# Patient Record
Sex: Female | Born: 1970 | Race: White | Hispanic: Yes | Smoking: Never smoker
Health system: Southern US, Community
[De-identification: ages and names within clinical notes are randomized; demographics above are authoritative.]

## PROBLEM LIST (undated history)

## (undated) ENCOUNTER — Inpatient Hospital Stay: Payer: Self-pay

## (undated) DIAGNOSIS — O99210 Obesity complicating pregnancy, unspecified trimester: Secondary | ICD-10-CM

## (undated) DIAGNOSIS — O09529 Supervision of elderly multigravida, unspecified trimester: Secondary | ICD-10-CM

## (undated) DIAGNOSIS — O24419 Gestational diabetes mellitus in pregnancy, unspecified control: Secondary | ICD-10-CM

## (undated) DIAGNOSIS — F329 Major depressive disorder, single episode, unspecified: Secondary | ICD-10-CM

## (undated) DIAGNOSIS — F32A Depression, unspecified: Secondary | ICD-10-CM

## (undated) DIAGNOSIS — R87629 Unspecified abnormal cytological findings in specimens from vagina: Secondary | ICD-10-CM

## (undated) HISTORY — PX: NO PAST SURGERIES: SHX2092

---

## 2011-01-08 ENCOUNTER — Ambulatory Visit: Payer: Self-pay | Admitting: Advanced Practice Midwife

## 2011-02-18 ENCOUNTER — Encounter: Payer: Self-pay | Admitting: Maternal and Fetal Medicine

## 2011-06-30 ENCOUNTER — Inpatient Hospital Stay: Payer: Self-pay

## 2014-07-05 DIAGNOSIS — O09529 Supervision of elderly multigravida, unspecified trimester: Secondary | ICD-10-CM

## 2014-07-05 HISTORY — DX: Supervision of elderly multigravida, unspecified trimester: O09.529

## 2015-03-05 ENCOUNTER — Other Ambulatory Visit: Payer: Self-pay | Admitting: Advanced Practice Midwife

## 2015-03-05 DIAGNOSIS — F1010 Alcohol abuse, uncomplicated: Secondary | ICD-10-CM

## 2015-03-24 ENCOUNTER — Ambulatory Visit: Payer: Self-pay

## 2015-03-27 ENCOUNTER — Ambulatory Visit
Admission: RE | Admit: 2015-03-27 | Discharge: 2015-03-27 | Disposition: A | Discharge status: Home / Self Care | Payer: Medicaid Other | Source: Ambulatory Visit | Attending: Advanced Practice Midwife | Admitting: Advanced Practice Midwife

## 2015-03-27 ENCOUNTER — Ambulatory Visit
Admission: RE | Admit: 2015-03-27 | Discharge: 2015-03-27 | Disposition: A | Discharge status: Home / Self Care | Payer: Medicaid Other | Source: Ambulatory Visit | Attending: Obstetrics & Gynecology | Admitting: Obstetrics & Gynecology

## 2015-03-27 DIAGNOSIS — Z3A22 22 weeks gestation of pregnancy: Secondary | ICD-10-CM | POA: Insufficient documentation

## 2015-03-27 DIAGNOSIS — O09522 Supervision of elderly multigravida, second trimester: Secondary | ICD-10-CM | POA: Diagnosis not present

## 2015-03-27 DIAGNOSIS — O09529 Supervision of elderly multigravida, unspecified trimester: Secondary | ICD-10-CM | POA: Insufficient documentation

## 2015-03-27 DIAGNOSIS — F1010 Alcohol abuse, uncomplicated: Secondary | ICD-10-CM

## 2015-03-27 HISTORY — DX: Major depressive disorder, single episode, unspecified: F32.9

## 2015-03-27 HISTORY — DX: Unspecified abnormal cytological findings in specimens from vagina: R87.629

## 2015-03-27 HISTORY — DX: Supervision of elderly multigravida, unspecified trimester: O09.529

## 2015-03-27 HISTORY — DX: Obesity complicating pregnancy, unspecified trimester: O99.210

## 2015-03-27 HISTORY — DX: Depression, unspecified: F32.A

## 2015-03-27 NOTE — Progress Notes (Addendum)
Referring Provider:   [facility] Department Length of Consultation: 40 minutes  [name] was referred to [facility] for genetic counseling because of advanced maternal age.  The patient will be 44 years old at the time of delivery.  This note summarizes the information we discussed.    We explained that the chance of a chromosome abnormality increases with maternal age.  Chromosomes and examples of chromosome problems were reviewed.  Humans typically have 46 chromosomes in each cell, with half passed through each sperm and egg.  Any change in the number or structure of chromosomes can increase the risk of problems in the physical and mental development of a pregnancy.   Based upon age of the patient and the current gestational age, the chance of any chromosome abnormality was 1 in 58. The chance of Down syndrome, the most common chromosome problem associated with maternal age, was 1 in 73.  The risk of chromosome problems is in addition to the 3% general population risk for birth defects and mental retardation.  The greatest chance, of course, is that the baby would be born in good health.  We discussed the following prenatal screening and testing options for this pregnancy:  Maternal serum marker screening, a blood test that measures pregnancy proteins, can provide risk assessments for Down syndrome, trisomy 18, and open neural tube defects (spina bifida, anencephaly). Because it does not directly examine the fetus, it cannot positively diagnose or rule out these problems. This testing was ordered previously by [facility], however, her dating changed today by ultrasound so a recalculation of the results is pending.    Targeted ultrasound uses high frequency sound waves to create an image of the developing fetus.  An ultrasound is often recommended as a routine means of evaluating the pregnancy.  It is also used to screen for fetal anatomy problems (for example, a heart  defect) that might be suggestive of a chromosomal or other abnormality.   Amniocentesis involves the removal of a small amount of amniotic fluid from the sac surrounding the fetus with the use of a thin needle inserted through the maternal abdomen and uterus.  Ultrasound guidance is used throughout the procedure.  Fetal cells from amniotic fluid are directly evaluated and > 99.5% of chromosome problems and > 98% of open neural tube defects can be detected. This procedure is generally performed after the 15th week of pregnancy.  The main risks to this procedure include complications leading to miscarriage in less than 1 in 200 cases (0.5%).  We also reviewed the availability of cell free fetal DNA testing from maternal blood to determine whether or not the baby may have either Down syndrome, trisomy 47, or trisomy 22.  This test utilizes a maternal blood sample and DNA sequencing technology to isolate circulating cell free fetal DNA from maternal plasma.  The fetal DNA can then be analyzed for DNA sequences that are derived from the three most common chromosomes involved in aneuploidy, chromosomes 13, 18, and 21.  If the overall amount of DNA is greater than the expected level for any of these chromosomes, aneuploidy is suspected.  While we do not consider it a replacement for invasive testing and karyotype analysis, a negative result from this testing would be reassuring, though not a guarantee of a normal chromosome complement for the baby.  An abnormal result is certainly suggestive of an abnormal chromosome complement, though we would still recommend CVS or amniocentesis to confirm any findings from  this testing.  We obtained a detailed family history and pregnancy history. This is the fourth pregnancy for this couple.  They have three children who are in good health.  The patient also has a 42 year old from another relationship who is in good health.  The patient stated that the father of the baby has a  maternal aunt with short stature.  We discussed that more information would be needed to assess the concern based on this history.  The remainder of the family history is unremarkable for birth defects, mental retardation, recurrent pregnancy loss or known chromosome abnormalities.  [name] reported no complications other than a UTI in this pregnancy.  She reported some alcohol use prior to learning that she was pregnant, but stated no use since that time.  Alcohol consumption during pregnancy has been associated with a number of birth defects including growth delays, small head size, heart defects, eye anomalies and facial differences as well as learning disabilities and behavioral problems.  The risk of these to occur tends to increase with the amount of alcohol consumed, however, malformations have been seen with as little as two drinks per day.  Because there is no safe amount of alcohol consumption during pregnancy, we suggest women completely avoid alcohol while pregnant.  A level 2 ultrasound and fetal echocardiogram (a detailed ultrasound of the fetal heart after 20 weeks) may help to detect growth delays or birth defects associated with alcohol use.  However, it is important to remember that not all birth defects can be identified prenatally.  After consideration of the options, [name] elected to proceed with a detailed ultrasound.  She declined InformaSeq testing and amniocentesis.  Results of the recalculated maternal serum screening will be communicated with [facility] when they become available.  An ultrasound was performed at the time of the visit.  The gestational age was consistent with  22 weeks.  Fetal anatomy appeared normal, though this cannot rule out chromosome conditions.  Please refer to the ultrasound report for details of that study.  [name] was encouraged to call with questions or concerns.  We can be contacted at [phone].   Cherly Anderson, MS,  CGC  Addendum:  The recalculation of the maternal serum screening results is within normal limits.  Grotegut, [place] A, MD

## 2015-04-14 ENCOUNTER — Observation Stay: Payer: Medicaid Other

## 2015-04-14 ENCOUNTER — Encounter: Payer: Self-pay | Admitting: Emergency Medicine

## 2015-04-14 ENCOUNTER — Observation Stay
Admission: EM | Admit: 2015-04-14 | Discharge: 2015-04-14 | Disposition: A | Discharge status: Home / Self Care | Payer: Medicaid Other | Attending: Obstetrics and Gynecology | Admitting: Obstetrics and Gynecology

## 2015-04-14 DIAGNOSIS — M549 Dorsalgia, unspecified: Secondary | ICD-10-CM | POA: Insufficient documentation

## 2015-04-14 DIAGNOSIS — Y998 Other external cause status: Secondary | ICD-10-CM | POA: Diagnosis not present

## 2015-04-14 DIAGNOSIS — Y92096 Garden or yard of other non-institutional residence as the place of occurrence of the external cause: Secondary | ICD-10-CM | POA: Insufficient documentation

## 2015-04-14 DIAGNOSIS — S098XXA Other specified injuries of head, initial encounter: Secondary | ICD-10-CM

## 2015-04-14 DIAGNOSIS — Z3A22 22 weeks gestation of pregnancy: Secondary | ICD-10-CM | POA: Diagnosis not present

## 2015-04-14 DIAGNOSIS — O09522 Supervision of elderly multigravida, second trimester: Secondary | ICD-10-CM

## 2015-04-14 DIAGNOSIS — Y93H2 Activity, gardening and landscaping: Secondary | ICD-10-CM | POA: Insufficient documentation

## 2015-04-14 DIAGNOSIS — R51 Headache: Secondary | ICD-10-CM | POA: Diagnosis not present

## 2015-04-14 DIAGNOSIS — W1809XA Striking against other object with subsequent fall, initial encounter: Secondary | ICD-10-CM | POA: Diagnosis not present

## 2015-04-14 DIAGNOSIS — O26892 Other specified pregnancy related conditions, second trimester: Secondary | ICD-10-CM | POA: Diagnosis not present

## 2015-04-14 LAB — GLUCOSE, CAPILLARY: GLUCOSE-CAPILLARY: 128 mg/dL — AB (ref 65–99)

## 2015-04-14 MED ORDER — CYCLOBENZAPRINE HCL 10 MG PO TABS
10.0000 mg | ORAL_TABLET | Freq: Three times a day (TID) | ORAL | Status: DC | PRN
  Administered 2015-04-14: 10 mg via ORAL
  Filled 2015-04-14: qty 1

## 2015-04-14 MED ORDER — BUTALBITAL-APAP-CAFFEINE 50-325-40 MG PO TABS
2.0000 | ORAL_TABLET | ORAL | Status: DC | PRN
  Administered 2015-04-14: 2 via ORAL
  Filled 2015-04-14 (×2): qty 2
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
## 2015-04-24 NOTE — Discharge Summary (Signed)
Patient was seen in triage; she was not admitted to the floor. No discharge dictation required. 

## 2015-05-27 ENCOUNTER — Encounter: Payer: Medicaid Other | Attending: Advanced Practice Midwife | Admitting: Dietician

## 2015-05-27 VITALS — BP 125/64 | Ht 60.0 in | Wt 175.0 lb

## 2015-05-27 DIAGNOSIS — O24419 Gestational diabetes mellitus in pregnancy, unspecified control: Secondary | ICD-10-CM | POA: Insufficient documentation

## 2015-05-27 DIAGNOSIS — O24410 Gestational diabetes mellitus in pregnancy, diet controlled: Secondary | ICD-10-CM

## 2015-05-27 NOTE — Patient Instructions (Signed)
Read booklet on Gestational Diabetes Follow Gestational Meal Planning Guidelines Avoid sweetened beverages-drink plenty of water Complete a 3 Day Food Record and bring to next appointment Check blood sugars 4 x day - before breakfast and 2 hrs after every meal and record  Call MD for prescription for meter strips and lancets Strips   Accuchek Smartview test strips  Lancets   Accuchek Fastclix lancets Bring blood sugar log to next appointment  Walk 20-30 minutes at least 5 x week if permitted by MD Next appointment   06-03-15

## 2015-05-27 NOTE — Progress Notes (Signed)
Diabetes Self-Management Education  Visit Type: First/Initial  Appt. Start Time:1330 Appt. End Time: 1500  05/27/2015  [name], identified by name and date of birth, is a 44 y.o. female with a diagnosis of Diabetes: Gestational Diabetes.   ASSESSMENT  Blood pressure 125/64, height 5' (1.524 m), weight 175 lb (79.379 kg), last menstrual period 11/08/2014. Body mass index is 34.18 kg/(m^2).  Lacks knowledge of diabetes management      Diabetes Self-Management Education - 05/27/15 1516    Visit Information   Visit Type First/Initial   Initial Visit   Diabetes Type Gestational Diabetes   Health Coping   How would you rate your overall health? Good   Psychosocial Assessment   Patient Belief/Attitude about Diabetes Motivated to manage diabetes   Self-care barriers English as a second language   Patient Concerns Glycemic Control  prevent complications   Special Needs --  material in Spanish   Preferred Learning Style Auditory;Visual;Hands on   Learning Readiness Ready   Complications   How often do you check your blood sugar? 0 times/day (not testing)   Have you had a dilated eye exam in the past 12 months? No   Have you had a dental exam in the past 12 months? No   Are you checking your feet? Yes   How many days per week are you checking your feet? 6   Dietary Intake   Breakfast --  drinks 1 glass of milk for breakfast   Snack (morning) --  eats snack at 9am (fruit, cookies, bread, rice)   Lunch --  eats meal, beans with 3 tortillas for lunch; eats fried foods 4-5x/wk and snack foods 2-3x/wk   Snack (afternoon) --  does not eat afternoon snack   Dinner --  often eats 6 tortillas, fruit, cereals-adds sugar to cereals    Snack (evening) --  no bedtime snack   Beverage(s) --  drinks regular soda 1x/day; adds sugar to coffee x1/day; drinks  water 2-3x/day   Exercise   Exercise Type --  no regular exercise   Patient Education   Previous Diabetes  Education No   Disease state  --  discussed GDM and options for treatment   Nutrition management  Role of diet in the treatment of diabetes and the relationship between the three main macronutrients and blood glucose level  reviewed portion sizes and carohydrate & protein sources   Physical activity and exercise  --  role of exercise for managing BG's -encouraged to walk 30 min/day  if permitted by MD   Monitoring Taught/evaluated SMBG meter.;Purpose and frequency of SMBG.;Taught/discussed recording of test results and interpretation of SMBG.  gave pt Accuchek Nano meter and instructed on its use-BG 143 (2 hr pp-ate 3 tortillas, beans potato, chicken   Psychosocial adjustment Role of stress on diabetes   Preconception care Pregnancy and GDM  Role of pre-pregnancy blood glucose control on the development of the fetus;Role of family planning for patients with diabetes;Reviewed with patient blood glucose goals with pregnancy   Personal strategies to promote health Lifestyle issues that need to be addressed for better diabetes care      Individualized Plan for Diabetes Self-Management Training:   Learning Objective:  Patient will have a greater understanding of diabetes self-management. Patient education plan is to attend individual and/or group sessions per assessed needs and concerns.     Patient Instructions  Read booklet on Gestational Diabetes Follow Gestational Meal Planning Guidelines Avoid sweetened beverages-drink plenty of water Complete  a 3 Day Food Record and bring to next appointment Check blood sugars 4 x day - before breakfast and 2 hrs after every meal and record  Call MD for prescription for meter strips and lancets Strips   Accuchek Smartview test strips  Lancets   Accuchek Fastclix lancets Bring blood sugar log to next appointment  Walk 20-30 minutes at least 5 x week if permitted by MD Next appointment   06-03-15   Education material provided: General Meal Planning  Guidelines for Healthy Pregnancy, Accuchek Nano meter, GDM video  If problems or questions, patient to contact team via:  [phone]  Future DSME appointment:  06-03-15

## 2015-06-02 NOTE — Addendum Note (Signed)
Encounter addended by: [place] Grotegut, MD on: 06/02/2015  4:32 PM<BR>     Documentation filed: Charges VN, Notes Section

## 2015-06-03 ENCOUNTER — Encounter: Payer: Medicaid Other | Admitting: Dietician

## 2015-06-03 VITALS — BP 100/64 | Ht 60.0 in | Wt 171.7 lb

## 2015-06-03 DIAGNOSIS — O24419 Gestational diabetes mellitus in pregnancy, unspecified control: Secondary | ICD-10-CM | POA: Diagnosis not present

## 2015-06-03 DIAGNOSIS — O24410 Gestational diabetes mellitus in pregnancy, diet controlled: Secondary | ICD-10-CM

## 2015-06-03 NOTE — Progress Notes (Signed)
   Patient's BG record indicates BGs are generally within goal ranges; 2 post-meal BGs in 130s.   Patient has lost 3.3lbs since 05/27/15 (1 week ago). She also reports some episodes of feeling shaky prior to mealtimes.  She reported feeling shaky during our visit, and tested her BG on her meter, which was 85mg /dl.  Patient's food diary indicates some meals low in carbohydrate. Most meals do contain a protein source.  Advised increasing protein portions and allowing for 2-3 carbohydrate servings with meals to prevent further weight loss and shaky symptoms.   Provided 1700kcal meal plan, and wrote individualized menus based on patient's food preferences.  Instructed patient on food safety, including avoidance of Listeriosis, and limiting mercury from fish.  Discussed importance of maintaining healthy lifestyle habits to reduce risk of Type 2 DM as well as Gestational DM with any future pregnancies.  Advised patient to use any remaining testing supplies to test some BGs after delivery, and to have BG tested ideally annually, as well as prior to attempting future pregnancies.

## 2015-06-05 ENCOUNTER — Ambulatory Visit
Admission: RE | Admit: 2015-06-05 | Discharge: 2015-06-05 | Disposition: A | Discharge status: Home / Self Care | Payer: Medicaid Other | Source: Ambulatory Visit | Attending: Obstetrics and Gynecology | Admitting: Obstetrics and Gynecology

## 2015-06-05 VITALS — BP 104/66 | HR 85 | Temp 98.1°F | Wt 171.4 lb

## 2015-06-05 DIAGNOSIS — O09522 Supervision of elderly multigravida, second trimester: Secondary | ICD-10-CM | POA: Diagnosis not present

## 2015-06-05 DIAGNOSIS — Z3A22 22 weeks gestation of pregnancy: Secondary | ICD-10-CM | POA: Diagnosis not present

## 2015-06-05 DIAGNOSIS — O09523 Supervision of elderly multigravida, third trimester: Secondary | ICD-10-CM

## 2015-07-03 ENCOUNTER — Ambulatory Visit
Admission: RE | Admit: 2015-07-03 | Discharge: 2015-07-03 | Disposition: A | Discharge status: Home / Self Care | Payer: Medicaid Other | Source: Ambulatory Visit | Attending: Obstetrics and Gynecology | Admitting: Obstetrics and Gynecology

## 2015-07-03 DIAGNOSIS — O09523 Supervision of elderly multigravida, third trimester: Secondary | ICD-10-CM

## 2015-07-06 NOTE — L&D Delivery Note (Signed)
Delivery Note Called into room for sudden onset of recurrent variable/early decelerations with Q31min ctx. RN checked patient and found her to be 3cm. During tetanic ctx, fetal heart rate dropped to 60 for 2 minutes. 0.78mcg terbutaline given IM. No cessation of contractions. Rechecked and found to be fully dilated and +2. At 5:33 AM a viable female was delivered via LOA.  APGAR: , ; weight 3710g .   Placenta status: intact,  Cord: 3 vessels with the following complications: none  Anesthesia:  none Episiotomy:  none Lacerations:  none Est. Blood Loss (mL):  300cc  Mom to postpartum.  Baby to Couplet care / Skin to Skin.  Cathy Bradley 07/31/2015, 5:50 AM

## 2015-07-25 ENCOUNTER — Encounter: Payer: Self-pay | Admitting: *Deleted

## 2015-07-25 ENCOUNTER — Inpatient Hospital Stay
Admission: RE | Admit: 2015-07-25 | Discharge: 2015-07-25 | Disposition: A | Discharge status: Home / Self Care | Payer: Medicaid Other | Attending: Obstetrics and Gynecology | Admitting: Obstetrics and Gynecology

## 2015-07-25 DIAGNOSIS — Z3A39 39 weeks gestation of pregnancy: Secondary | ICD-10-CM | POA: Insufficient documentation

## 2015-07-25 DIAGNOSIS — O24410 Gestational diabetes mellitus in pregnancy, diet controlled: Secondary | ICD-10-CM | POA: Diagnosis not present

## 2015-07-25 DIAGNOSIS — O99210 Obesity complicating pregnancy, unspecified trimester: Secondary | ICD-10-CM | POA: Insufficient documentation

## 2015-07-25 DIAGNOSIS — O09523 Supervision of elderly multigravida, third trimester: Secondary | ICD-10-CM | POA: Insufficient documentation

## 2015-07-25 HISTORY — DX: Gestational diabetes mellitus in pregnancy, unspecified control: O24.419

## 2015-07-25 LAB — GLUCOSE, CAPILLARY: GLUCOSE-CAPILLARY: 82 mg/dL (ref 65–99)
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
## 2015-07-25 NOTE — OB Triage Note (Signed)
NST

## 2015-07-25 NOTE — Discharge Instructions (Signed)
Parto vaginal (Vaginal Delivery) Durante el parto, el mdico la ayudar a dar a luz a su beb. En elparto vaginal, deber pujar para que el beb salga por la vagina. Sin embargo, antes de que pueda sacar al beb, es necesario que ocurran ciertas cosas. La abertura del tero (cuello del tero) tiene que ablandarse, hacerse ms delgado y abrirse (dilatar) hasta que llegue a 10 cm. Adems, el beb tiene que bajar desde el tero a la vagina. SIGNOS DE TRABAJO DE PARTO  El mdico tendr primero que asegurarse de que usted est en [place]. Algunos signos son:   Eliminar lo que se llama tapn mucoso antes del inicio del trabajo de [place]. Este es una pequea cantidad de mucosidad teida con sangre.  Tener contracciones uterinas regulares y dolorosas.   El [employer] las contracciones debe acortarse  Las molestias y Chief Technology Officer se harn ms intensos gradualmente.  El dolor de las contracciones empeora al caminar y no se alivia con el reposo.   El cuello del tero se hace mas delgado (se borra) y se dilata. ANTES DEL PARTO Una vez que se inicie el trabajo de parto y sea admitida en el hospital o sanatorio, el mdico podr hacer lo siguiente:   Education officer, environmental un examen fsico.  Controlar si hay complicaciones relacionadas con Kathie Dike de parto.  Verificar su presin arterial, temperatura y pulso y la frecuencia cardaca (signos vitales).   Determinar si se ha roto el saco amnitico y cundo ha ocurrido.  Realizar un examen vaginal (utilizando un guante estril y un lubricante) para determinar:  La posicin (presentacin) del beb. El beb se presenta con la cabeza primero (vertex) en el canal de parto (vagina), o estn los pies o las nalgas primero (de nalgas)?  El nivel (estacin) de la cabeza del beb dentro del canal de parto.  El borramiento y la dilatacin del cuello uterino  El monitor fetal electrnico generalmente se coloca sobre el abdomen al Environmental health practitioner. Se utiliza para  controlar las contracciones y la frecuencia cardaca del beb.  Cuando el monitor est en el abdomen (monitor fetal externo), slo toma la frecuencia y la duracin de las contracciones. No informa acerca de la intensidad de las contracciones.  Si el mdico necesita saber exactamente la intensidad de las contracciones o cul es la frecuencia cardaca del beb, colocar un monitor interno en la vagina y [place]. El mdico [employer]orne riesgos y los beneficios de usar un monitor interno y le pedir autorizacin antes de Scientist, product/process development dispositivo.  El monitoreo fetal continuo ser necesario si le han aplicado una epidural, si le administran ciertos medicamentos (como oxitocina) y si tiene complicaciones del [place] o del trabajo de [place].  Podrn colocarle una va intravenosa en una vena del brazo para suministrarle lquidos y medicamentos, si es necesario. TRES ETAPAS DEL TRABAJO DE PARTO Y EL PARTO [place] de parto y el parto normales se dividen en tres etapas. Primera etapa Esta etapa comienza cuando comienzan las contracciones regulares y el cuello comienza a borrarse y dilatarse. Finaliza cuando el cuello est completamente abierto (completamente dilatado). La primera etapa es la etapa ms larga del [place] de parto y puede durar desde 3 horas a 15 horas.  Algunos mtodos estn disponibles para ayudar con el dolor del [place]. Usted y su mdico decidirn qu opcin es la mejor para usted. Las opciones incluyen:   Medicamentos narcticos. Estos son medicamentos fuertes que usted puede recibir a Games developer de una va [place] o  como inyeccin en el msculo. Estos medicamentos Associate Professor pero no hacen que desaparezca completamente.  Epidural. Se administra un medicamento a travs de un tubo delgado que se inserta en la espalda. El medicamento adormece la parte inferior del cuerpo y evita el dolor en esa zona.  Bloqueo paracervical Es una inyeccin de un anestsico en cada lado del cuello  uterino.  Usted podr pedir un parto natural, que implica que no se usen analgsicos ni epidural durante el parto y [place]. En cambio, podr tener otro tipo de ayuda como ejercicios respiratorios para hacer frente al [employer]. Segunda etapa La segunda etapa del trabajo de parto comienza cuando el cuello se ha dilatado completamente a 10 cm. Contina hasta que usted puja al beb hacia abajo, por el canal de [place], y el beb nace. Esta etapa puede durar slo algunos minutos o algunas horas.  La posicin del la [place] del beb a medida que pasa por el canal de parto, es informada como un nmero, llamado estacin. Si la cabeza del beb no ha iniciado su descenso, la estacin se describe como que est en menos 3 (-3). Cuando la cabeza del beb est en la estacin cero, est en el medio del canal de parto y se encaja en la pelvis. La estacin en la que se encuentra el beb indica el progreso de la segunda etapa del [place] de [place].  Cuando el beb nace, el mdico lo sostendr con la cabeza hacia abajo para evitar que el lquido amnitico, el moco y la sangre entren en los pulmones del beb. La boca y la nariz del beb podrn ser succionadas con un pequeo bulbo para retirar todo lquido adicional.  El mdico podr Scientific laboratory technician al beb sobre su estmago. Es importante evitar que el beb tome fro. Para hacerlo, el mdico secar al beb, lo colocar directamente sobre su piel, (sin mantas entre usted y el beb) y lo cubrir con mantas secas y tibias.  Se corta el cordn umbilical. Tercera etapa Durante la tercera etapa del trabajo de parto, el mdico sacar la placenta (alumbramiento) y se asegurar de que el sangrado est controlado. La salida de la placenta generalmente demora 5 minutos pero puede tardar hasta 30 minutos. Luego de la salida de la placenta, le darn un medicamento por va intravenosa o inyectable para ayudar a Engineer, manufacturing tero y Air traffic controller. Si planea amamantar al beb,  puede intentar en este momento Luego de la salida de la placenta, el tero debe contraerse y [place]. Si el tero no queda firme, el mdico lo Engineer, maintenance (IT). Esto es importante debido a que la contraccin del tero ayuda a Location manager sangrado en el sitio en que la placenta estaba unida al tero. Si el tero no se contrae adecuadamente ni [employer], podr causar un sangrado abundante. Si hay mucho sangrado, podrn darle medicamentos para contraer el tero y Therapist, music.    Esta informacin no tiene Theme park manager el consejo del mdico. Asegrese de hacerle al mdico cualquier pregunta que tenga.   Document Released: 06/03/2008 Document Revised: 07/12/2014 Elsevier Interactive Patient Education 2016 [employer]. Evaluacin de los movimientos fetales  (Fetal Movement Counts) Nombre del paciente: __________________________________________________ Micheline Chapman estimada: ____________________ Caroleen Hamman de los movimientos fetales es muy recomendable en los embarazos de alto riesgo, pero tambin es una buena idea que lo hagan todas las [place]. El Firefighter que comience a contarlos a las 28 semanas de [place]. Los  movimientos fetales suelen aumentar:   Despus de una comida completa.  Despus de la actividad fsica.  Despus de comer o beber [employer] o fro.  En reposo. Preste atencin cuando sienta que el beb est ms activo. Esto le ayudar a notar un patrn de ciclos de vigilia y sueo de su beb y cules son los factores que contribuyen a un aumento de los movimientos fetales. Es importante llevar a cabo un recuento de movimientos fetales, al mismo tiempo cada da, cuando el beb normalmente est ms activo.  CMO CONTAR LOS MOVIMIENTOS FETALES  Busque un lugar tranquilo y cmodo para sentarse o recostarse sobre el lado izquierdo. Al recostarse sobre su lado izquierdo, le proporciona una mejor circulacin de [place] y oxgeno al beb.  Anote el da y  la hora en una hoja de papel o en un diario.  Comience contando las pataditas, revoloteos, chasquidos, vueltas o pinchazos en un perodo de 2 horas. Debe sentir al menos 10 movimientos en 2 horas.  Si no siente 10 movimientos en 2 horas, espere 2  3 horas y cuente de nuevo. Busque cambios en el patrn o si no cuenta lo suficiente en 2 horas. SOLICITE ATENCIN MDICA SI:   Siente menos de 10 pataditas en 2 horas, en dos intentos.  No hay movimientos durante una hora.  El patrn se modifica o le lleva ms tiempo Art gallery manager las 10 pataditas.  Siente que el beb no se mueve como lo hace habitualmente. Fecha: ____________ Movimientos: ____________ Stevan Born inicio: ____________ Stevan Born finalizacin: ____________  Franco Nones: ____________ Movimientos: ____________ Stevan Born inicio: ____________ Stevan Born finalizacin: ____________  Franco Nones: ____________ Movimientos: ____________ Stevan Born inicio: ____________ Stevan Born finalizacin: ____________  Franco Nones: ____________ Movimientos: ____________ Stevan Born inicio: ____________ Stevan Born finalizacin: ____________  Franco Nones: ____________ Movimientos: ____________ Stevan Born inicio: ____________ Mammie Russian de finalizacin: ____________  Franco Nones: ____________ Movimientos: ____________ Mammie Russian de inicio: ____________ Mammie Russian de finalizacin: ____________  Franco Nones: ____________ Movimientos: ____________ Mammie Russian de inicio: ____________ Mammie Russian de finalizacin: ____________  Franco Nones: ____________ Movimientos: ____________ Mammie Russian de inicio: ____________ Mammie Russian de finalizacin: ____________  Franco Nones: ____________ Movimientos: ____________ Mammie Russian de inicio: ____________ Mammie Russian de finalizacin: ____________  Franco Nones: ____________ Movimientos: ____________ Mammie Russian de inicio: ____________ Mammie Russian de finalizacin: ____________  Franco Nones: ____________ Movimientos: ____________ Mammie Russian de inicio: ____________ Mammie Russian de finalizacin: ____________  Franco Nones: ____________ Movimientos: ____________ Mammie Russian de inicio: ____________ Mammie Russian de  finalizacin: ____________  Franco Nones: ____________ Movimientos: ____________ Mammie Russian de inicio: ____________ Mammie Russian de finalizacin: ____________  Franco Nones: ____________ Movimientos: ____________ Mammie Russian de inicio: ____________ Mammie Russian de finalizacin: ____________  Franco Nones: ____________ Movimientos: ____________ Mammie Russian de inicio: ____________ Mammie Russian de finalizacin: ____________  Franco Nones: ____________ Movimientos: ____________ Mammie Russian de inicio: ____________ Mammie Russian de finalizacin: ____________  Franco Nones: ____________ Movimientos: ____________ Mammie Russian de inicio: ____________ Mammie Russian de finalizacin: ____________  Franco Nones: ____________ Movimientos: ____________ Mammie Russian de inicio: ____________ Mammie Russian de finalizacin: ____________  Franco Nones: ____________ Movimientos: ____________ Mammie Russian de inicio: ____________ Mammie Russian de finalizacin: ____________  Franco Nones: ____________ Movimientos: ____________ Mammie Russian de inicio: ____________ Mammie Russian de finalizacin: ____________  Franco Nones: ____________ Movimientos: ____________ Mammie Russian de inicio: ____________ Mammie Russian de finalizacin: ____________  Franco Nones: ____________ Movimientos: ____________ Mammie Russian de inicio: ____________ Mammie Russian de finalizacin: ____________  Franco Nones: ____________ Movimientos: ____________ Mammie Russian de inicio: ____________ Mammie Russian de finalizacin: ____________  Franco Nones: ____________ Movimientos: ____________ Mammie Russian de inicio: ____________ Mammie Russian de finalizacin: ____________  Franco Nones: ____________ Movimientos: ____________ Mammie Russian de inicio: ____________ Mammie Russian de finalizacin: ____________  Franco Nones: ____________ Movimientos: ____________ Mammie Russian de inicio: ____________ Mammie Russian de finalizacin: ____________  Franco Nones: ____________ Movimientos: ____________ Mammie Russian  de inicio: ____________ Stevan Born finalizacin: ____________  Franco Nones: ____________ Movimientos: ____________ Stevan Born inicio: ____________ Stevan Born finalizacin: ____________  Franco Nones: ____________ Movimientos: ____________ Stevan Born inicio: ____________ Stevan Born finalizacin: ____________  Franco Nones:  ____________ Movimientos: ____________ Stevan Born inicio: ____________ Stevan Born finalizacin: ____________  Franco Nones: ____________ Movimientos: ____________ Stevan Born inicio: ____________ Stevan Born finalizacin: ____________  Franco Nones: ____________ Movimientos: ____________ Stevan Born inicio: ____________ Mammie Russian de finalizacin: ____________  Franco Nones: ____________ Movimientos: ____________ Mammie Russian de inicio: ____________ Mammie Russian de finalizacin: ____________  Franco Nones: ____________ Movimientos: ____________ Mammie Russian de inicio: ____________ Mammie Russian de finalizacin: ____________  Franco Nones: ____________ Movimientos: ____________ Mammie Russian de inicio: ____________ Mammie Russian de finalizacin: ____________  Franco Nones: ____________ Movimientos: ____________ Mammie Russian de inicio: ____________ Mammie Russian de finalizacin: ____________  Franco Nones: ____________ Movimientos: ____________ Mammie Russian de inicio: ____________ Mammie Russian de finalizacin: ____________  Franco Nones: ____________ Movimientos: ____________ Mammie Russian de inicio: ____________ Mammie Russian de finalizacin: ____________  Franco Nones: ____________ Movimientos: ____________ Mammie Russian de inicio: ____________ Mammie Russian de finalizacin: ____________  Franco Nones: ____________ Movimientos: ____________ Mammie Russian de inicio: ____________ Mammie Russian de finalizacin: ____________  Franco Nones: ____________ Movimientos: ____________ Mammie Russian de inicio: ____________ Mammie Russian de finalizacin: ____________  Franco Nones: ____________ Movimientos: ____________ Mammie Russian de inicio: ____________ Mammie Russian de finalizacin: ____________  Franco Nones: ____________ Movimientos: ____________ Mammie Russian de inicio: ____________ Mammie Russian de finalizacin: ____________  Franco Nones: ____________ Movimientos: ____________ Mammie Russian de inicio: ____________ Mammie Russian de finalizacin: ____________  Franco Nones: ____________ Movimientos: ____________ Mammie Russian de inicio: ____________ Mammie Russian de finalizacin: ____________  Franco Nones: ____________ Movimientos: ____________ Mammie Russian de inicio: ____________ Mammie Russian de finalizacin: ____________  Franco Nones: ____________ Movimientos: ____________ Mammie Russian  de inicio: ____________ Mammie Russian de finalizacin: ____________  Franco Nones: ____________ Movimientos: ____________ Mammie Russian de inicio: ____________ Mammie Russian de finalizacin: ____________  Franco Nones: ____________ Movimientos: ____________ Mammie Russian de inicio: ____________ Mammie Russian de finalizacin: ____________  Franco Nones: ____________ Movimientos: ____________ Mammie Russian de inicio: ____________ Mammie Russian de finalizacin: ____________  Franco Nones: ____________ Movimientos: ____________ Mammie Russian de inicio: ____________ Mammie Russian de finalizacin: ____________  Franco Nones: ____________ Movimientos: ____________ Mammie Russian de inicio: ____________ Mammie Russian de finalizacin: ____________  Franco Nones: ____________ Movimientos: ____________ Mammie Russian de inicio: ____________ Mammie Russian de finalizacin: ____________  Franco Nones: ____________ Movimientos: ____________ Mammie Russian de inicio: ____________ Mammie Russian de finalizacin: ____________  Franco Nones: ____________ Movimientos: ____________ Mammie Russian de inicio: ____________ Mammie Russian de finalizacin: ____________  Franco Nones: ____________ Movimientos: ____________ Mammie Russian de inicio: ____________ Mammie Russian de finalizacin: ____________    Esta informacin no tiene como fin reemplazar el consejo del mdico. Asegrese de hacerle al mdico cualquier pregunta que tenga.   Document Released: 09/28/2007 Document Revised: 06/07/2012 Elsevier Interactive Patient Education [employer]

## 2015-07-27 ENCOUNTER — Other Ambulatory Visit: Payer: Self-pay | Admitting: Obstetrics and Gynecology
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
## 2015-07-30 ENCOUNTER — Inpatient Hospital Stay
Admission: RE | Admit: 2015-07-30 | Discharge: 2015-08-02 | DRG: 767 | Disposition: A | Discharge status: Home / Self Care | Payer: Medicaid Other | Attending: Obstetrics and Gynecology | Admitting: Obstetrics and Gynecology

## 2015-07-30 ENCOUNTER — Other Ambulatory Visit: Payer: Self-pay | Admitting: Obstetrics and Gynecology

## 2015-07-30 DIAGNOSIS — O09523 Supervision of elderly multigravida, third trimester: Secondary | ICD-10-CM | POA: Diagnosis not present

## 2015-07-30 DIAGNOSIS — Z302 Encounter for sterilization: Secondary | ICD-10-CM | POA: Diagnosis not present

## 2015-07-30 DIAGNOSIS — Z3A39 39 weeks gestation of pregnancy: Secondary | ICD-10-CM | POA: Diagnosis not present

## 2015-07-30 DIAGNOSIS — O99214 Obesity complicating childbirth: Secondary | ICD-10-CM | POA: Diagnosis present

## 2015-07-30 DIAGNOSIS — E669 Obesity, unspecified: Secondary | ICD-10-CM | POA: Diagnosis present

## 2015-07-30 DIAGNOSIS — O24429 Gestational diabetes mellitus in childbirth, unspecified control: Principal | ICD-10-CM | POA: Diagnosis present

## 2015-07-30 DIAGNOSIS — O24419 Gestational diabetes mellitus in pregnancy, unspecified control: Secondary | ICD-10-CM | POA: Diagnosis present

## 2015-07-30 LAB — TYPE AND SCREEN
ABO/RH(D): O POS
Antibody Screen: NEGATIVE

## 2015-07-30 LAB — CBC
HEMATOCRIT: 36.1 % (ref 35.0–47.0)
Hemoglobin: 12.2 g/dL (ref 12.0–16.0)
MCH: 31 pg (ref 26.0–34.0)
MCHC: 33.7 g/dL (ref 32.0–36.0)
MCV: 92 fL (ref 80.0–100.0)
PLATELETS: 116 10*3/uL — AB (ref 150–440)
RBC: 3.92 MIL/uL (ref 3.80–5.20)
RDW: 14.3 % (ref 11.5–14.5)
WBC: 6.9 10*3/uL (ref 3.6–11.0)

## 2015-07-30 LAB — ABO/RH: ABO/RH(D): O POS

## 2015-07-30 LAB — GLUCOSE, CAPILLARY: GLUCOSE-CAPILLARY: 75 mg/dL (ref 65–99)

## 2015-07-30 MED ORDER — LACTATED RINGERS IV SOLN
INTRAVENOUS | Status: DC
  Administered 2015-07-30 – 2015-07-31 (×2): via INTRAVENOUS

## 2015-07-30 MED ORDER — OXYTOCIN 10 UNIT/ML IJ SOLN
2.5000 [IU]/h | INTRAVENOUS | Status: DC
  Filled 2015-07-30: qty 4

## 2015-07-30 MED ORDER — ACETAMINOPHEN 325 MG PO TABS: 650.0000 mg | ORAL_TABLET | ORAL | Status: DC | PRN

## 2015-07-30 MED ORDER — LIDOCAINE HCL (PF) 1 % IJ SOLN: 30.0000 mL | INTRAMUSCULAR | Status: DC | PRN

## 2015-07-30 MED ORDER — CITRIC ACID-SODIUM CITRATE 334-500 MG/5ML PO SOLN
30.0000 mL | ORAL | Status: DC | PRN
  Filled 2015-07-30: qty 15

## 2015-07-30 MED ORDER — DINOPROSTONE 10 MG VA INST
10.0000 mg | VAGINAL_INSERT | Freq: Once | VAGINAL | Status: AC
  Administered 2015-07-30: 10 mg via VAGINAL
  Filled 2015-07-30: qty 1

## 2015-07-30 MED ORDER — BUTORPHANOL TARTRATE 1 MG/ML IJ SOLN
1.0000 mg | INTRAMUSCULAR | Status: DC | PRN
  Administered 2015-07-31 (×2): 1 mg via INTRAVENOUS
  Filled 2015-07-30 (×2): qty 1

## 2015-07-30 MED ORDER — LACTATED RINGERS IV SOLN: 500.0000 mL | INTRAVENOUS | Status: DC | PRN

## 2015-07-30 MED ORDER — ONDANSETRON HCL 4 MG/2ML IJ SOLN: 4.0000 mg | Freq: Four times a day (QID) | INTRAMUSCULAR | Status: DC | PRN

## 2015-07-30 MED ORDER — OXYTOCIN BOLUS FROM INFUSION: 500.0000 mL | INTRAVENOUS | Status: DC

## 2015-07-30 MED ORDER — DINOPROSTONE 10 MG VA INST: 10.0000 mg | VAGINAL_INSERT | Freq: Once | VAGINAL | Status: DC

## 2015-07-30 MED ORDER — TERBUTALINE SULFATE 1 MG/ML IJ SOLN
0.2500 mg | Freq: Once | INTRAMUSCULAR | Status: AC | PRN
  Administered 2015-07-31: 0.25 mg via SUBCUTANEOUS
  Filled 2015-07-30: qty 1
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
## 2015-07-31 LAB — GLUCOSE, CAPILLARY
GLUCOSE-CAPILLARY: 90 mg/dL (ref 65–99)
Glucose-Capillary: 39 mg/dL — CL (ref 65–99)

## 2015-07-31 MED ORDER — OXYCODONE-ACETAMINOPHEN 5-325 MG PO TABS
1.0000 | ORAL_TABLET | ORAL | Status: DC | PRN
  Administered 2015-07-31 – 2015-08-02 (×4): 2 via ORAL
  Filled 2015-07-31 (×4): qty 2

## 2015-07-31 MED ORDER — BENZOCAINE-MENTHOL 20-0.5 % EX AERO: 1.0000 "application " | INHALATION_SPRAY | CUTANEOUS | Status: DC | PRN

## 2015-07-31 MED ORDER — OXYTOCIN 40 UNITS IN LACTATED RINGERS INFUSION - SIMPLE MED
INTRAVENOUS | Status: AC
  Administered 2015-07-31: 40 [IU]
  Filled 2015-07-31: qty 1000

## 2015-07-31 MED ORDER — ACETAMINOPHEN 325 MG PO TABS: 650.0000 mg | ORAL_TABLET | ORAL | Status: DC | PRN

## 2015-07-31 MED ORDER — ONDANSETRON HCL 4 MG PO TABS: 4.0000 mg | ORAL_TABLET | ORAL | Status: DC | PRN

## 2015-07-31 MED ORDER — LANOLIN HYDROUS EX OINT: TOPICAL_OINTMENT | CUTANEOUS | Status: DC | PRN

## 2015-07-31 MED ORDER — SIMETHICONE 80 MG PO CHEW
80.0000 mg | CHEWABLE_TABLET | ORAL | Status: DC | PRN
Start: 2015-07-31 — End: 2015-08-01

## 2015-07-31 MED ORDER — ONDANSETRON HCL 4 MG/2ML IJ SOLN: 4.0000 mg | INTRAMUSCULAR | Status: DC | PRN

## 2015-07-31 MED ORDER — ZOLPIDEM TARTRATE 5 MG PO TABS: 5.0000 mg | ORAL_TABLET | Freq: Every evening | ORAL | Status: DC | PRN

## 2015-07-31 MED ORDER — AMMONIA AROMATIC IN INHA
RESPIRATORY_TRACT | Status: AC
  Filled 2015-07-31: qty 10

## 2015-07-31 MED ORDER — SENNOSIDES-DOCUSATE SODIUM 8.6-50 MG PO TABS: 2.0000 | ORAL_TABLET | ORAL | Status: DC

## 2015-07-31 MED ORDER — OXYTOCIN 10 UNIT/ML IJ SOLN
INTRAMUSCULAR | Status: AC
  Filled 2015-07-31: qty 2

## 2015-07-31 MED ORDER — PRENATAL MULTIVITAMIN CH
1.0000 | ORAL_TABLET | Freq: Every day | ORAL | Status: DC
Start: 2015-07-31 — End: 2015-08-01
  Administered 2015-07-31: 1 via ORAL
  Filled 2015-07-31: qty 1

## 2015-07-31 MED ORDER — LIDOCAINE HCL (PF) 1 % IJ SOLN
INTRAMUSCULAR | Status: AC
  Filled 2015-07-31: qty 30

## 2015-07-31 MED ORDER — SODIUM CHLORIDE 0.9% FLUSH
3.0000 mL | Freq: Two times a day (BID) | INTRAVENOUS | Status: DC
  Administered 2015-07-31: 3 mL via INTRAVENOUS

## 2015-07-31 MED ORDER — DIBUCAINE 1 % RE OINT: 1.0000 "application " | TOPICAL_OINTMENT | RECTAL | Status: DC | PRN

## 2015-07-31 MED ORDER — WITCH HAZEL-GLYCERIN EX PADS: 1.0000 "application " | MEDICATED_PAD | CUTANEOUS | Status: DC | PRN

## 2015-07-31 MED ORDER — SODIUM CHLORIDE 0.9 % IV SOLN: 250.0000 mL | INTRAVENOUS | Status: DC | PRN

## 2015-07-31 MED ORDER — TETANUS-DIPHTH-ACELL PERTUSSIS 5-2.5-18.5 LF-MCG/0.5 IM SUSP: 0.5000 mL | Freq: Once | INTRAMUSCULAR | Status: DC

## 2015-07-31 MED ORDER — DIPHENHYDRAMINE HCL 25 MG PO CAPS: 25.0000 mg | ORAL_CAPSULE | Freq: Four times a day (QID) | ORAL | Status: DC | PRN

## 2015-07-31 MED ORDER — SODIUM CHLORIDE 0.9% FLUSH
3.0000 mL | INTRAVENOUS | Status: DC | PRN
  Administered 2015-07-31: 3 mL via INTRAVENOUS
  Filled 2015-07-31: qty 3

## 2015-07-31 MED ORDER — IBUPROFEN 600 MG PO TABS
600.0000 mg | ORAL_TABLET | Freq: Four times a day (QID) | ORAL | Status: DC
  Administered 2015-07-31 – 2015-08-01 (×4): 600 mg via ORAL
  Filled 2015-07-31 (×4): qty 1

## 2015-07-31 NOTE — Progress Notes (Signed)
CBG in results review entered in error.  Mom's baby band scanned instead of baby's band, by nursing assistant.  Results of 39 at 0832 not pt's CBG.

## 2015-07-31 NOTE — Progress Notes (Signed)
Interpreter present Tubal ligation Consent signed and in pt's chart.

## 2015-07-31 NOTE — Progress Notes (Addendum)
PPD #0 SVD at 0533, interval note  S:  Reports feeling good, but tired              Tolerating po/ No nausea or vomiting             Bleeding is light             Pain controlled with Motrin             Up ad lib / ambulatory / voiding QS  Newborn breast feeding   O:               VS: BP 110/68 mmHg  Pulse 108  Temp(Src) 97.9 F (36.6 C) (Oral)  Resp 17  LMP 11/08/2014   LABS:              Recent Labs  07/30/15 2039  WBC 6.9  HGB 12.2  PLT 116*               Blood type: --/--/O POS (01/25 2126)  Rubella:   Immune                    I&O: Intake/Output      01/25 0701 - 01/26 0700 01/26 0701 - 01/27 0700   I.V. 250    Total Intake 250     Net +250                        Physical Exam:             Alert and oriented X3  Lungs: Clear and unlabored  Heart: regular rate and rhythm / no mumurs  Abdomen: soft, non-tender, non-distended              Fundus: firm, non-tender, U-E  Perineum: intact, no significant edema, no significant erythema  Lochia: light, no clots   Extremities: no edema, no calf pain or tenderness    A: PPD # 0  Doing well - stable status  Borderline GDM - delivered, one low blood sugar 39 this morning   P: Routine post partum orders  Planning for Tubal Ligation on 08/01/15 - requesting Medicaid papers from [facility]  NPO after midnight   May have regular diet today  Keep IV in  [place], [place]

## 2015-08-01 ENCOUNTER — Inpatient Hospital Stay: Payer: Medicaid Other | Admitting: Anesthesiology

## 2015-08-01 ENCOUNTER — Encounter
Admission: RE | Disposition: A | Discharge status: Home / Self Care | Payer: Self-pay | Source: Home / Self Care | Attending: Obstetrics and Gynecology

## 2015-08-01 HISTORY — PX: TUBAL LIGATION: SHX77

## 2015-08-01 LAB — CBC
HCT: 30.4 % — ABNORMAL LOW (ref 35.0–47.0)
Hemoglobin: 10.4 g/dL — ABNORMAL LOW (ref 12.0–16.0)
MCH: 31.6 pg (ref 26.0–34.0)
MCHC: 34.2 g/dL (ref 32.0–36.0)
MCV: 92.4 fL (ref 80.0–100.0)
PLATELETS: 99 10*3/uL — AB (ref 150–440)
RBC: 3.3 MIL/uL — AB (ref 3.80–5.20)
RDW: 14.5 % (ref 11.5–14.5)
WBC: 7.1 10*3/uL (ref 3.6–11.0)

## 2015-08-01 LAB — GLUCOSE, CAPILLARY
Glucose-Capillary: 73 mg/dL (ref 65–99)
Glucose-Capillary: 79 mg/dL (ref 65–99)

## 2015-08-01 LAB — RPR: RPR Ser Ql: NONREACTIVE

## 2015-08-01 SURGERY — LIGATION, FALLOPIAN TUBE, POSTPARTUM
Anesthesia: General | Laterality: Bilateral

## 2015-08-01 MED ORDER — SENNOSIDES-DOCUSATE SODIUM 8.6-50 MG PO TABS
2.0000 | ORAL_TABLET | ORAL | Status: DC
  Administered 2015-08-01: 2 via ORAL
  Filled 2015-08-01: qty 2

## 2015-08-01 MED ORDER — FENTANYL CITRATE (PF) 100 MCG/2ML IJ SOLN
25.0000 ug | INTRAMUSCULAR | Status: DC | PRN
Start: 2015-08-01 — End: 2015-08-01
  Administered 2015-08-01 (×2): 50 ug via INTRAVENOUS

## 2015-08-01 MED ORDER — ZOLPIDEM TARTRATE 5 MG PO TABS: 5.0000 mg | ORAL_TABLET | Freq: Every evening | ORAL | Status: DC | PRN

## 2015-08-01 MED ORDER — ONDANSETRON HCL 4 MG/2ML IJ SOLN: 4.0000 mg | INTRAMUSCULAR | Status: DC | PRN

## 2015-08-01 MED ORDER — IBUPROFEN 600 MG PO TABS
600.0000 mg | ORAL_TABLET | Freq: Four times a day (QID) | ORAL | Status: DC
Start: 2015-08-02 — End: 2015-08-02
  Administered 2015-08-01 – 2015-08-02 (×3): 600 mg via ORAL
  Filled 2015-08-01 (×3): qty 1

## 2015-08-01 MED ORDER — MEASLES, MUMPS & RUBELLA VAC ~~LOC~~ INJ
0.5000 mL | INJECTION | Freq: Once | SUBCUTANEOUS | Status: DC
  Filled 2015-08-01: qty 0.5

## 2015-08-01 MED ORDER — LANOLIN HYDROUS EX OINT
TOPICAL_OINTMENT | CUTANEOUS | Status: DC | PRN
Start: 2015-08-01 — End: 2015-08-02

## 2015-08-01 MED ORDER — FENTANYL CITRATE (PF) 100 MCG/2ML IJ SOLN
INTRAMUSCULAR | Status: DC | PRN
  Administered 2015-08-01: 100 ug via INTRAVENOUS

## 2015-08-01 MED ORDER — DEXAMETHASONE SODIUM PHOSPHATE 10 MG/ML IJ SOLN
INTRAMUSCULAR | Status: DC | PRN
  Administered 2015-08-01: 4 mg via INTRAVENOUS

## 2015-08-01 MED ORDER — DIPHENHYDRAMINE HCL 25 MG PO CAPS: 25.0000 mg | ORAL_CAPSULE | Freq: Four times a day (QID) | ORAL | Status: DC | PRN

## 2015-08-01 MED ORDER — SIMETHICONE 80 MG PO CHEW: 80.0000 mg | CHEWABLE_TABLET | ORAL | Status: DC | PRN

## 2015-08-01 MED ORDER — ONDANSETRON HCL 4 MG PO TABS
8.0000 mg | ORAL_TABLET | Freq: Three times a day (TID) | ORAL | Status: DC | PRN
  Administered 2015-08-01: 8 mg via ORAL
  Filled 2015-08-01: qty 2

## 2015-08-01 MED ORDER — FAMOTIDINE 20 MG PO TABS
40.0000 mg | ORAL_TABLET | Freq: Once | ORAL | Status: AC
  Administered 2015-08-01: 40 mg via ORAL
  Filled 2015-08-01: qty 2

## 2015-08-01 MED ORDER — WITCH HAZEL-GLYCERIN EX PADS
1.0000 | MEDICATED_PAD | CUTANEOUS | Status: DC | PRN
Start: 2015-08-01 — End: 2015-08-02

## 2015-08-01 MED ORDER — KETOROLAC TROMETHAMINE 30 MG/ML IJ SOLN
INTRAMUSCULAR | Status: DC | PRN
  Administered 2015-08-01: 30 mg via INTRAVENOUS

## 2015-08-01 MED ORDER — BENZOCAINE-MENTHOL 20-0.5 % EX AERO: 1.0000 "application " | INHALATION_SPRAY | CUTANEOUS | Status: DC | PRN

## 2015-08-01 MED ORDER — MAGNESIUM HYDROXIDE 400 MG/5ML PO SUSP: 30.0000 mL | ORAL | Status: DC | PRN

## 2015-08-01 MED ORDER — ACETAMINOPHEN 325 MG PO TABS: 650.0000 mg | ORAL_TABLET | ORAL | Status: DC | PRN

## 2015-08-01 MED ORDER — PROMETHAZINE HCL 25 MG/ML IJ SOLN: 6.2500 mg | INTRAMUSCULAR | Status: DC | PRN

## 2015-08-01 MED ORDER — BUPIVACAINE HCL (PF) 0.5 % IJ SOLN
INTRAMUSCULAR | Status: AC
  Filled 2015-08-01: qty 30

## 2015-08-01 MED ORDER — ONDANSETRON HCL 4 MG/2ML IJ SOLN
INTRAMUSCULAR | Status: DC | PRN
  Administered 2015-08-01: 4 mg via INTRAVENOUS

## 2015-08-01 MED ORDER — SUCCINYLCHOLINE CHLORIDE 20 MG/ML IJ SOLN
INTRAMUSCULAR | Status: DC | PRN
  Administered 2015-08-01: 100 mg via INTRAVENOUS

## 2015-08-01 MED ORDER — FERROUS SULFATE 325 (65 FE) MG PO TABS
325.0000 mg | ORAL_TABLET | Freq: Two times a day (BID) | ORAL | Status: DC
  Administered 2015-08-02: 325 mg via ORAL
  Filled 2015-08-01: qty 1

## 2015-08-01 MED ORDER — BUPIVACAINE HCL 0.5 % IJ SOLN
INTRAMUSCULAR | Status: DC | PRN
  Administered 2015-08-01: 2 mL

## 2015-08-01 MED ORDER — LIDOCAINE HCL (CARDIAC) 20 MG/ML IV SOLN
INTRAVENOUS | Status: DC | PRN
  Administered 2015-08-01: 70 mg via INTRATRACHEAL

## 2015-08-01 MED ORDER — PROPOFOL 500 MG/50ML IV EMUL
INTRAVENOUS | Status: DC | PRN
  Administered 2015-08-01: 30 ug via INTRAVENOUS
  Administered 2015-08-01: 150 ug via INTRAVENOUS

## 2015-08-01 MED ORDER — ONDANSETRON HCL 4 MG PO TABS: 4.0000 mg | ORAL_TABLET | ORAL | Status: DC | PRN

## 2015-08-01 MED ORDER — PRENATAL MULTIVITAMIN CH: 1.0000 | ORAL_TABLET | Freq: Every day | ORAL | Status: DC

## 2015-08-01 MED ORDER — LACTATED RINGERS IV SOLN: INTRAVENOUS | Status: DC

## 2015-08-01 MED ORDER — METOCLOPRAMIDE HCL 10 MG PO TABS
10.0000 mg | ORAL_TABLET | Freq: Once | ORAL | Status: AC
  Administered 2015-08-01: 10 mg via ORAL
  Filled 2015-08-01: qty 1

## 2015-08-01 MED ORDER — LACTATED RINGERS IV SOLN
INTRAVENOUS | Status: DC
  Administered 2015-08-01 (×2): via INTRAVENOUS

## 2015-08-01 MED ORDER — FENTANYL CITRATE (PF) 100 MCG/2ML IJ SOLN
INTRAMUSCULAR | Status: AC
  Administered 2015-08-01: 50 ug via INTRAVENOUS
  Filled 2015-08-01: qty 2

## 2015-08-01 MED ORDER — DIBUCAINE 1 % RE OINT: 1.0000 "application " | TOPICAL_OINTMENT | RECTAL | Status: DC | PRN

## 2015-08-01 SURGICAL SUPPLY — 30 items
APPLICATOR COTTON TIP 6IN STRL (MISCELLANEOUS) ×3 IMPLANT
BLADE SURG SZ11 CARB STEEL (BLADE) ×3 IMPLANT
CANISTER SUCT 1200ML W/VALVE (MISCELLANEOUS) ×3 IMPLANT
CHLORAPREP W/TINT 26ML (MISCELLANEOUS) ×3 IMPLANT
CLOSURE WOUND 1/4X4 (GAUZE/BANDAGES/DRESSINGS) ×1
DRAPE LAPAROTOMY 100X77 ABD (DRAPES) ×3 IMPLANT
DRSG TEGADERM 2-3/8X2-3/4 SM (GAUZE/BANDAGES/DRESSINGS) ×3 IMPLANT
DRSG TEGADERM 4X4.75 (GAUZE/BANDAGES/DRESSINGS) ×3 IMPLANT
ELECT REM PT RETURN 9FT ADLT (ELECTROSURGICAL) ×3
ELECTRODE REM PT RTRN 9FT ADLT (ELECTROSURGICAL) ×1 IMPLANT
GAUZE SPONGE NON-WVN 2X2 STRL (MISCELLANEOUS) ×1 IMPLANT
GLOVE BIO SURGEON STRL SZ8 (GLOVE) ×3 IMPLANT
GOWN STRL REUS W/ TWL LRG LVL3 (GOWN DISPOSABLE) ×1 IMPLANT
GOWN STRL REUS W/ TWL XL LVL3 (GOWN DISPOSABLE) ×1 IMPLANT
GOWN STRL REUS W/TWL LRG LVL3 (GOWN DISPOSABLE) ×2
GOWN STRL REUS W/TWL XL LVL3 (GOWN DISPOSABLE) ×2
KIT RM TURNOVER STRD PROC AR (KITS) ×3 IMPLANT
LABEL OR SOLS (LABEL) ×3 IMPLANT
LIQUID BAND (GAUZE/BANDAGES/DRESSINGS) ×3 IMPLANT
NEEDLE HYPO 25X1 1.5 SAFETY (NEEDLE) ×3 IMPLANT
NS IRRIG 500ML POUR BTL (IV SOLUTION) ×3 IMPLANT
PACK BASIN MINOR ARMC (MISCELLANEOUS) ×3 IMPLANT
SPONGE VERSALON 2X2 STRL (MISCELLANEOUS) ×2
STRIP CLOSURE SKIN 1/4X4 (GAUZE/BANDAGES/DRESSINGS) ×2 IMPLANT
SUT PLAIN GUT 0 (SUTURE) ×3 IMPLANT
SUT VIC AB 2-0 UR6 27 (SUTURE) ×3 IMPLANT
SUT VIC AB 4-0 SH 27 (SUTURE) ×2
SUT VIC AB 4-0 SH 27XANBCTRL (SUTURE) ×1 IMPLANT
SWABSTK COMLB BENZOIN TINCTURE (MISCELLANEOUS) ×3 IMPLANT
SYRINGE 10CC LL (SYRINGE) ×3 IMPLANT

## 2015-08-01 NOTE — Anesthesia Procedure Notes (Signed)
Procedure Name: Intubation Date/Time: 08/01/2015 2:00 PM Performed by: Lynnae January Pre-anesthesia Checklist: Patient identified, Emergency Drugs available, Suction available, Patient being monitored and Timeout performed Patient Re-evaluated:Patient Re-evaluated prior to inductionOxygen Delivery Method: Circle system utilized Preoxygenation: Pre-oxygenation with 100% oxygen Intubation Type: IV induction and Rapid sequence Laryngoscope Size: Mac and 3 Grade View: Grade I Tube type: Oral Number of attempts: 1 Airway Equipment and Method: Stylet Placement Confirmation: ETT inserted through vocal cords under direct vision and positive ETCO2 Secured at: 20 cm Tube secured with: Tape Dental Injury: Teeth and Oropharynx as per pre-operative assessment

## 2015-08-01 NOTE — Transfer of Care (Signed)
Immediate Anesthesia Transfer of Care Note  Patient: Cathy Bradley  Procedure(s) Performed: Procedure(s): POST PARTUM TUBAL LIGATION (Bilateral)  Patient Location: PACU  Anesthesia Type:General  Level of Consciousness: sedated and responds to stimulation  Airway & Oxygen Therapy: Patient Spontanous Breathing  Post-op Assessment: Report given to RN, Post -op Vital signs reviewed and stable, Patient moving all extremities X 4 and Patient able to stick tongue midline  Post vital signs: Reviewed and stable  Last Vitals:  Filed Vitals:   08/01/15 1235 08/01/15 1237  BP:  103/55  Pulse:  65  Temp: 37.3 C   Resp: 16     Complications: No apparent anesthesia complications

## 2015-08-01 NOTE — Progress Notes (Signed)
Post Partum Day 1, NPO  Except for po meds  Subjective: no complaints  Objective: Blood pressure 107/66, pulse 72, temperature 98.5 F (36.9 C), temperature source Oral, resp. rate 20, last menstrual period 11/08/2014, SpO2 97 %.  Physical Exam:  General: alert and cooperative Lochia: appropriate Uterine Fundus: firm Incision: n/a DVT Evaluation: No evidence of DVT seen on physical exam.   Recent Labs  07/30/15 2039 08/01/15 0703  HGB 12.2 10.4*  HCT 36.1 30.4*    Assessment/Plan: PP BTL  Today . Pt reconfirmed desire . Translator present . Pt is aware of the failure rate from the BTL . All questions answered   LOS: 2 days   SCHERMERHORN,THOMAS 08/01/2015, 10:03 AM

## 2015-08-01 NOTE — OR Nursing (Signed)
Translator present. Verified consents  With patient.  Patient desires bilateral tubal ligation.

## 2015-08-01 NOTE — Anesthesia Postprocedure Evaluation (Signed)
Anesthesia Post Note  Patient: Cathy Bradley  Procedure(s) Performed: Procedure(s) (LRB): POST PARTUM TUBAL LIGATION (Bilateral)  Patient location during evaluation: PACU Level of consciousness: oriented, sedated and patient cooperative Pain management: pain level controlled Vital Signs Assessment: post-procedure vital signs reviewed and stable Respiratory status: spontaneous breathing Cardiovascular status: blood pressure returned to baseline Postop Assessment: no headache Anesthetic complications: no    Last Vitals:  Filed Vitals:   08/01/15 1237 08/01/15 1439  BP: 103/55 108/75  Pulse: 65 77  Temp:  36.5 C  Resp:  16    Last Pain:  Filed Vitals:   08/01/15 1440  PainSc: 4                  Aerilynn Goin [employer]

## 2015-08-01 NOTE — Anesthesia Preprocedure Evaluation (Signed)
Anesthesia Evaluation  Patient identified by MRN, date of birth, ID band Patient awake    Reviewed: Allergy & Precautions, H&P , NPO status , Patient's Chart, lab work & pertinent test results, reviewed documented beta blocker date and time   Airway Mallampati: III  TM Distance: >3 FB Neck ROM: full    Dental no notable dental hx. (+) Teeth Intact   Pulmonary neg pulmonary ROS,    Pulmonary exam normal breath sounds clear to auscultation       Cardiovascular Exercise Tolerance: Good negative cardio ROS Normal cardiovascular exam Rhythm:regular Rate:Normal     Neuro/Psych PSYCHIATRIC DISORDERS (Depression) negative neurological ROS     GI/Hepatic negative GI ROS, Neg liver ROS,   Endo/Other  diabetes (Gestational)  Renal/GU negative Renal ROS  negative genitourinary   Musculoskeletal   Abdominal   Peds  Hematology negative hematology ROS (+)   Anesthesia Other Findings Past Medical History:   AMA (advanced maternal age) multigravida 35+    2016         Depression                                                   Vaginal Pap smear, abnormal                                  Obesity affecting pregnancy                                  Gestational diabetes                                         Reproductive/Obstetrics (+) Breast feeding                              Anesthesia Physical Anesthesia Plan  ASA: II  Anesthesia Plan: General   Post-op Pain Management:    Induction:   Airway Management Planned:   Additional Equipment:   Intra-op Plan:   Post-operative Plan:   Informed Consent: I have reviewed the patients History and Physical, chart, labs and discussed the procedure including the risks, benefits and alternatives for the proposed anesthesia with the patient or authorized representative who has indicated his/her understanding and acceptance.   Dental Advisory  Given  Plan Discussed with: Anesthesiologist, CRNA and Surgeon  Anesthesia Plan Comments:         Anesthesia Quick Evaluation

## 2015-08-01 NOTE — Brief Op Note (Signed)
07/30/2015 - 08/01/2015  2:22 PM  PATIENT:  Cathy Bradley  45 y.o. female  PRE-OPERATIVE DIAGNOSIS:  desires sterility  POST-OPERATIVE DIAGNOSIS:  desires sterility  PROCEDURE:  Procedure(s): POST PARTUM TUBAL LIGATION (Bilateral)  SURGEON:  Surgeon(s) and Role:    Suzy Bouchard, MD - Primary  PHYSICIAN ASSISTANT:   ASSISTANTS: none   ANESTHESIA:   general  EBL:     BLOOD ADMINISTERED:none  DRAINS: none   LOCAL MEDICATIONS USED:  MARCAINE     SPECIMEN:  Source of Specimen:  portion bilateral tube  DISPOSITION OF SPECIMEN:  PATHOLOGY  COUNTS:  YES  TOURNIQUET:  * No tourniquets in log *  DICTATION: .Other Dictation: Dictation Number dictacted   PLAN OF CARE: return to mother baby   PATIENT DISPOSITION:  PACU - hemodynamically stable.   Delay start of Pharmacological VTE agent (>24hrs) due to surgical blood loss or risk of bleeding: not applicable

## 2015-08-01 NOTE — Lactation Note (Signed)
This note was copied from the chart of Cathy Cathy Bradley. Lactation Consultation Note  Patient Name: Cathy Bradley [id number] Date: 08/01/2015     Maternal Data  Had a BTL today, baby was bottlefed formula at last feeding while pt in OR, pt encouraged to breastfeed at next feeding, this is pt's 5 th child, breastfed others and last for 2 yrs. No problems with latching baby to breast  Feeding Feeding Type: Bottle Fed - Formula  [facility] Score/Interventions                      Lactation Tools Discussed/Used     Consult Status      Cathy Bradley 08/01/2015, 6:20 PM

## 2015-08-02 MED ORDER — IBUPROFEN 600 MG PO TABS: 600.0000 mg | ORAL_TABLET | Freq: Four times a day (QID) | ORAL | Status: AC

## 2015-08-02 MED ORDER — OXYCODONE-ACETAMINOPHEN 5-325 MG PO TABS: 1.0000 | ORAL_TABLET | ORAL | Status: DC | PRN

## 2015-08-02 NOTE — Progress Notes (Signed)
Patient understands all discharge instructions and the need to make follow up appointments. Patient discharge via wheelchair with auxillary. 

## 2015-08-02 NOTE — Discharge Summary (Signed)
Obstetric Discharge Summary Reason for Admission: onset of labor Prenatal Procedures: none Intrapartum Procedures: spontaneous vaginal delivery Postpartum Procedures: P.P. tubal ligation Complications-Operative and Postpartum: none HEMOGLOBIN  Date Value Ref Range Status  08/01/2015 10.4* 12.0 - 16.0 g/dL Final   HCT  Date Value Ref Range Status  08/01/2015 30.4* 35.0 - 47.0 % Final    Physical Exam:  General: alert and cooperative Lochia: appropriate Uterine Fundus: firm Incision: no significant drainage DVT Evaluation: No evidence of DVT seen on physical exam.  Discharge Diagnoses: Term Pregnancy-delivered , s/p BTL  Discharge Information: Date: 08/02/2015 Activity: pelvic rest Diet: routine Medications: Ibuprofen and Percocet Condition: stable Instructions: refer to practice specific booklet Discharge to: home Follow-up Information    Follow up with Rubin Dais, MD In 2 weeks.   Specialty:  Obstetrics and Gynecology   Why:  For wound re-check   Contact information:   [address] [place] [address] [phone]       Newborn Data: Live born female  Birth Weight: 8 lb 2.9 oz (3710 g) APGAR: ,   Home with mother.  Cathy Bradley 08/02/2015, 9:13 AM

## 2015-08-02 NOTE — Op Note (Signed)
NAMEGUILIANNA, Cathy Bradley         ACCOUNT NO.:  [id number]  MEDICAL RECORD NO.:  [id number]  LOCATION:  349A                         FACILITY:  [facility]  PHYSICIAN:  Jennell Corner, MDDATE OF BIRTH:  December 06, 1970  DATE OF PROCEDURE: DATE OF DISCHARGE:                              OPERATIVE REPORT   PREOPERATIVE DIAGNOSIS:  Elected permanent sterilization.  POSTOPERATIVE DIAGNOSIS:  Elected permanent sterilization.  PROCEDURE:  Postpartum tubal ligation, [place].  ANESTHESIA:  General endotracheal anesthesia.  SURGEON:  Jennell Corner, MD.  INDICATIONS:  This is a 45 year old, gravida 5, now para 5, status post uncomplicated vaginal delivery.  The patient elects for permanent sterilization.  The patient has been counseled regarding the risks of the procedure and failure rate of 1/200 per year.  DESCRIPTION OF PROCEDURE:  After adequate general endotracheal anesthesia, the patient was placed in dorsal supine position.  A time- out was performed.  A 15 mm infraumbilical incision was made after injecting with 0.5% Marcaine.  Fascia was opened sharply followed by the peritoneum.  The patient's right fallopian tube was identified and grasped with a Babcock clamp, fimbriated, and was visualized; and at the midportion of the fallopian tube, 2 separate 0 plain gut sutures were placed, 1.5 cm portion of fallopian tube was removed.  Of note, this fallopian tube seemed very tortuous.  Good hemostasis was noted. Similar procedure was repeated on the patient's left fallopian tube after identifying the fimbriated end.  Two separate 0 plain gut sutures were applied at the midportion of the fallopian tube and a 1.5 cm portion of fallopian tube was removed.  Good hemostasis was noted. Fascia was closed with a running 2-0 Vicryl suture, and the skin was reapproximated with interrupted 4-0 Vicryl suture.  LiquiBand was placed and a Tegaderm was placed over top.  There were no  complications. Estimated blood loss minimal.  Intraop fluids 400 mL.  The patient tolerated the procedure well and was taken to the recovery in good condition.          ______________________________ Jennell Corner, MD     TS/MEDQ  D:  08/01/2015  T:  08/02/2015  Job:  [id number]

## 2015-08-02 NOTE — Discharge Instructions (Signed)
Bleeding: Your bleeding could continue up to 6 weeks, the flow should gradually decrease and the color should become dark then lightened over the next couple of weeks. If you notice you are bleeding heavily or passing clots larger than the size of your fist, PLEASE call your physician. No TAMPONS, DOUCHING, ENEMAS OR SEXUAL INTERCOURSE for 6 weeks.  ° °Stitches: Shower daily with mild soap and water. Stitches will dissolve over the next couple of weeks, if you experience any discomfort in the vaginal area you may sit in warm water 15-20 minutes, 3-4 times per day. Just enough water to cover vaginal area.  ° °AfterPains: This is the uterus contracting back to its normal position and size. Use medications prescribed or recommended by your physician to help relieve this discomfort.  ° °Bowels/Hemorrhoids: Drink plenty of water and stay active. Increase fiber, fresh fruits and vegetables in your diet.  ° °Rest/Activity: Rest when the baby is resting ° °Bathing: Shower daily! ° °Diet: Continue to eat extra calories until your follow up visit to help replenish nutrients and vitamins. If breastfeeding eat an extra 500-1000 and increase your fluid intake to 12 glasses a day.  ° °Contraception: Consult with your physician on what method of birth control you would like to use.  ° °Postpartum "BLUES": It is common to emotional days after delivery, however if it persist for greater than 2 weeks or if you feel concerned please let your physician know immediately. This is hormone driven and nothing you can control so please let someone know how you feel. ° °Follow Up Visit: Please schedule a follow up visit with your physician.  °

## 2015-08-05 LAB — SURGICAL PATHOLOGY

## 2016-05-26 IMAGING — US US MFM OB FOLLOW-UP
1 series · 14 of 28 positions shown · non-contrast
Comparison: none

[Series 1: us mfm ob follow-up · 0.23mm/px · 14 of 43 slices shown]
[im 2/43]
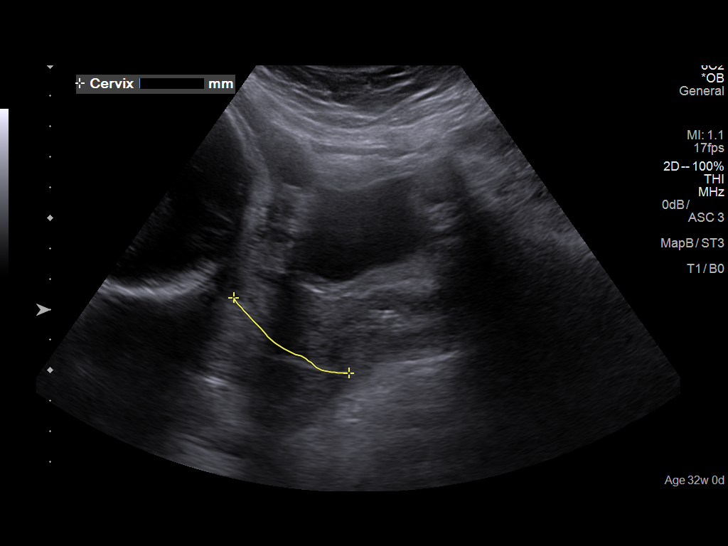
[im 5/43]
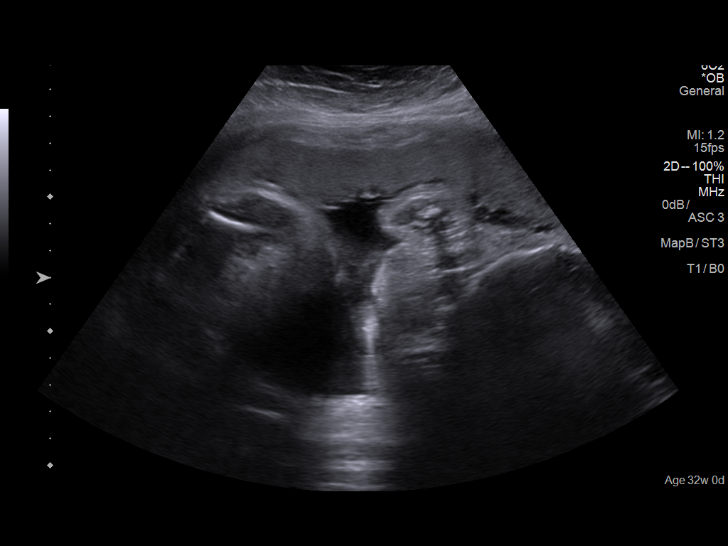
[im 8/43]
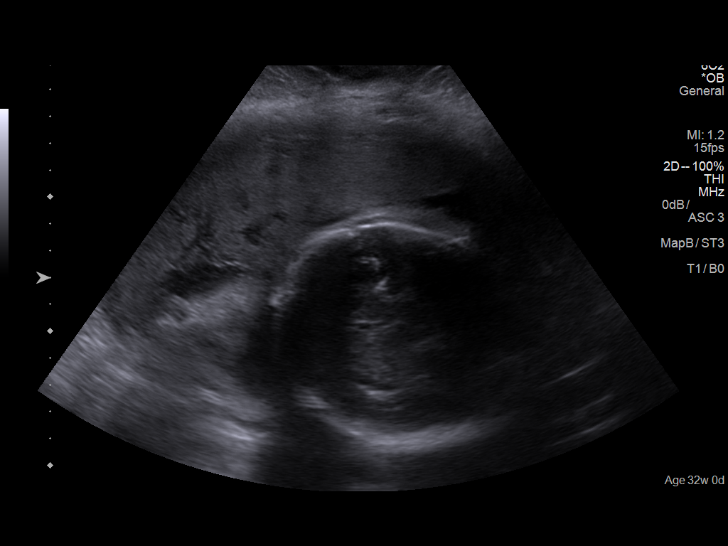
[im 11/43]
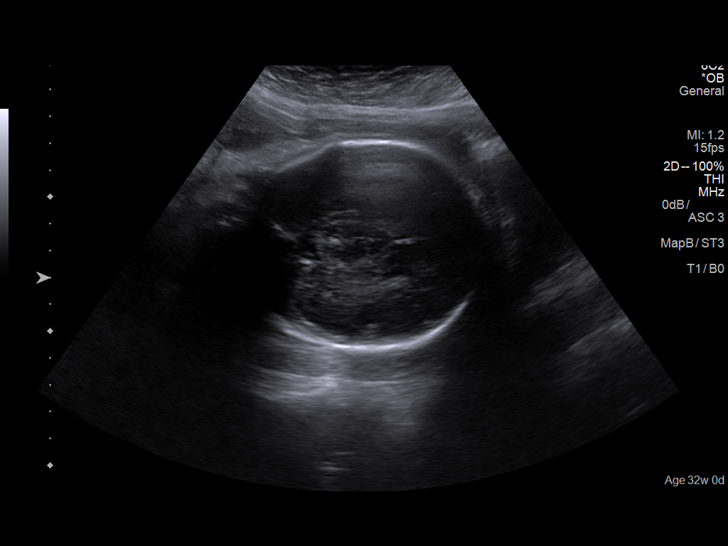
[im 15/43]
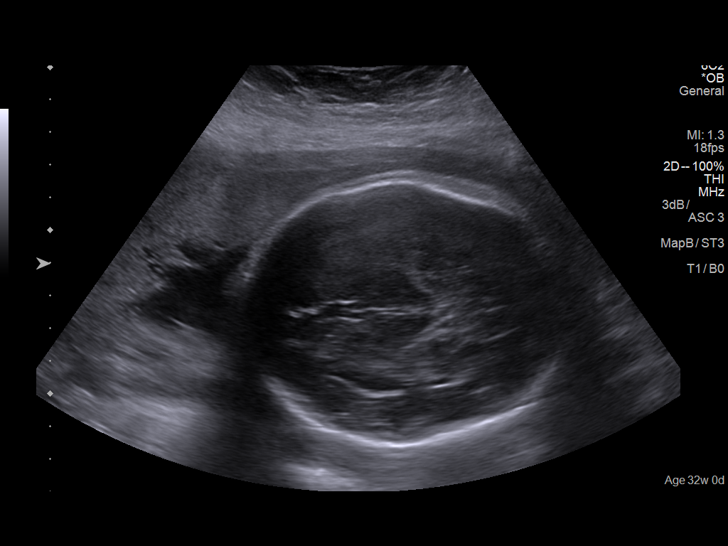
[im 18/43]
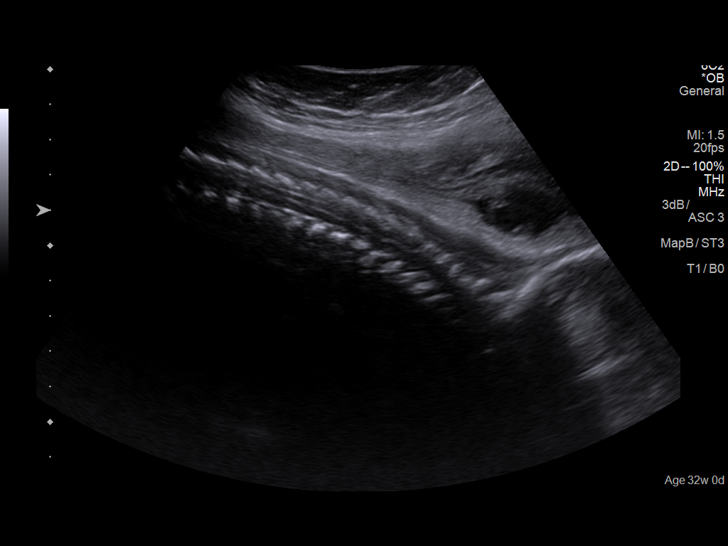
[im 21/43]
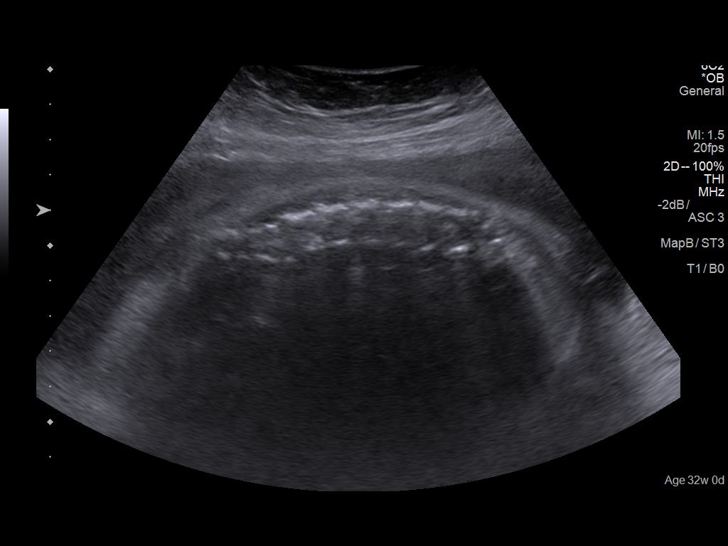
[im 24/43]
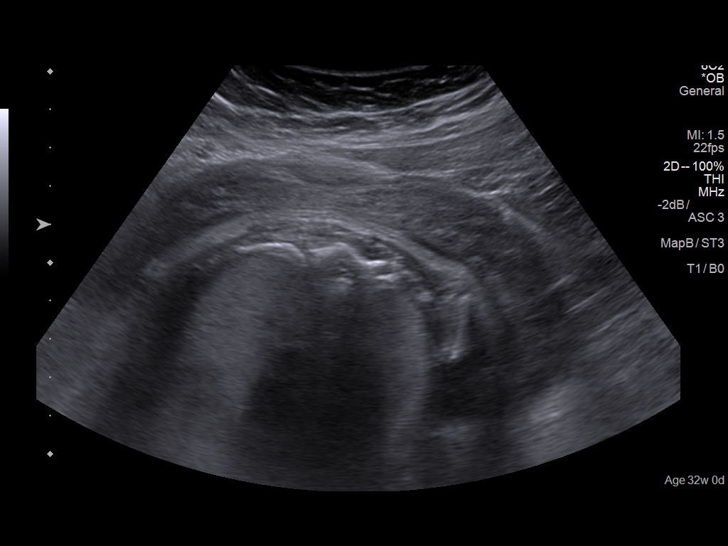
[im 27/43]
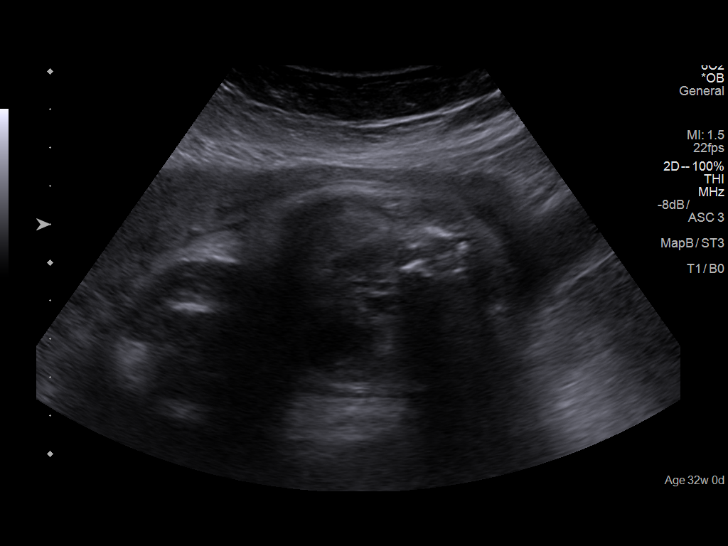
[im 30/43]
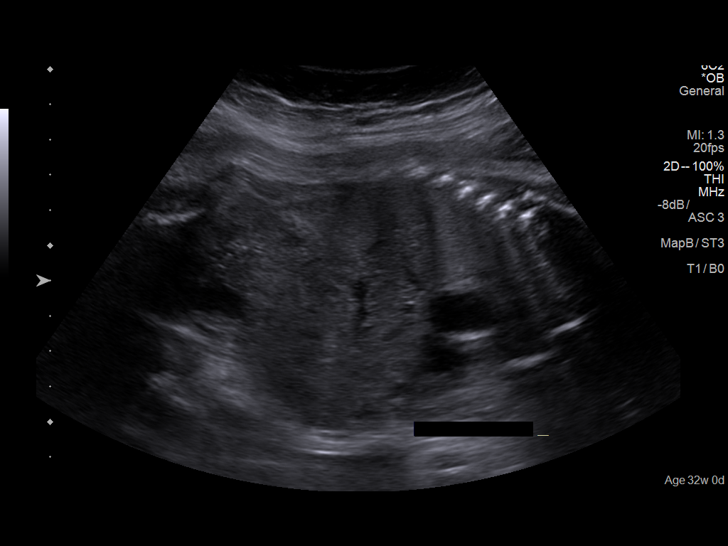
[im 33/43]
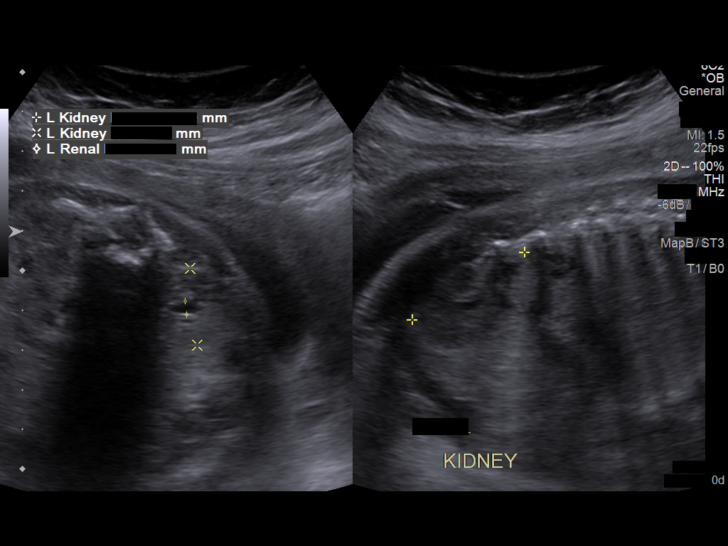
[im 36/43]
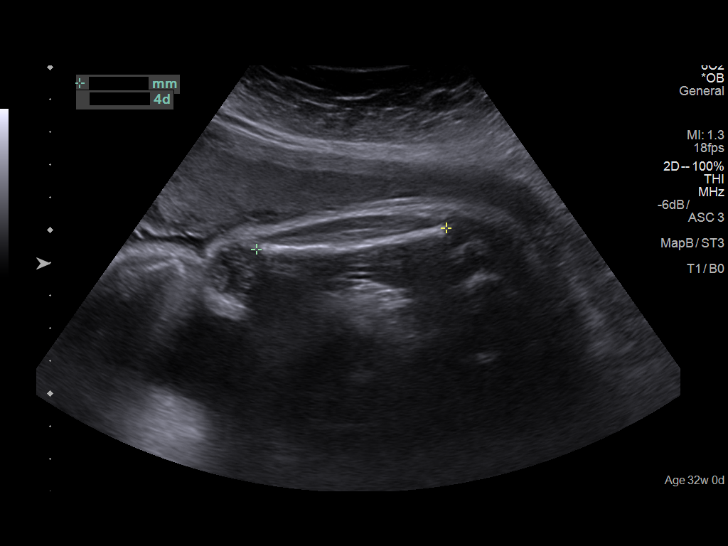
[im 39/43]
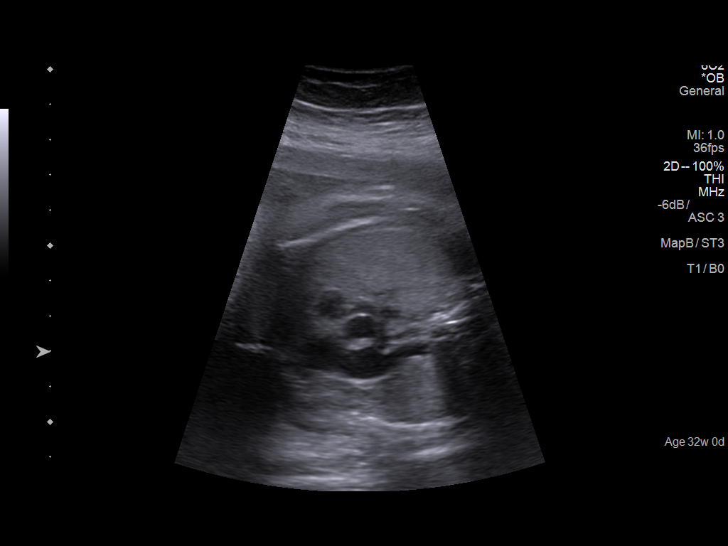
[im 43/43]
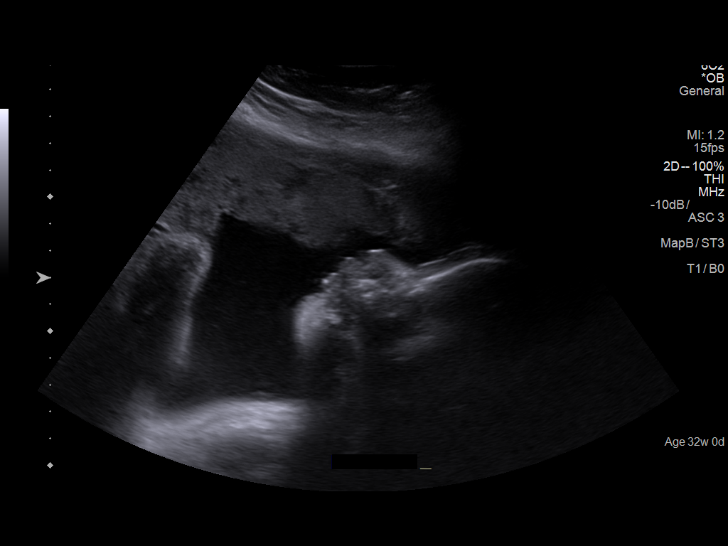

[14 of 28 positions shown; findings below may reference images not displayed]

Canned report from images found in remote index.

Refer to host system for actual result text.

## 2016-06-16 ENCOUNTER — Emergency Department: Payer: Self-pay

## 2016-06-16 ENCOUNTER — Encounter: Payer: Self-pay | Admitting: *Deleted

## 2016-06-16 ENCOUNTER — Emergency Department
Admission: EM | Admit: 2016-06-16 | Discharge: 2016-06-16 | Disposition: A | Discharge status: Home / Self Care | Payer: Self-pay | Attending: Emergency Medicine | Admitting: Emergency Medicine

## 2016-06-16 DIAGNOSIS — S61011A Laceration without foreign body of right thumb without damage to nail, initial encounter: Secondary | ICD-10-CM | POA: Insufficient documentation

## 2016-06-16 DIAGNOSIS — Y929 Unspecified place or not applicable: Secondary | ICD-10-CM | POA: Insufficient documentation

## 2016-06-16 DIAGNOSIS — Z79899 Other long term (current) drug therapy: Secondary | ICD-10-CM | POA: Insufficient documentation

## 2016-06-16 DIAGNOSIS — W269XXA Contact with unspecified sharp object(s), initial encounter: Secondary | ICD-10-CM | POA: Insufficient documentation

## 2016-06-16 DIAGNOSIS — S6701XA Crushing injury of right thumb, initial encounter: Secondary | ICD-10-CM

## 2016-06-16 DIAGNOSIS — Y990 Civilian activity done for income or pay: Secondary | ICD-10-CM | POA: Insufficient documentation

## 2016-06-16 DIAGNOSIS — Y9389 Activity, other specified: Secondary | ICD-10-CM | POA: Insufficient documentation

## 2016-06-16 MED ORDER — NAPROXEN 500 MG PO TABS
500.0000 mg | ORAL_TABLET | Freq: Once | ORAL | Status: DC
  Filled 2016-06-16: qty 1

## 2016-06-16 MED ORDER — ACETAMINOPHEN 500 MG PO TABS
ORAL_TABLET | ORAL | Status: AC
  Filled 2016-06-16: qty 2

## 2016-06-16 MED ORDER — ACETAMINOPHEN 500 MG PO TABS
1000.0000 mg | ORAL_TABLET | Freq: Once | ORAL | Status: AC
  Administered 2016-06-16: 1000 mg via ORAL

## 2016-06-16 MED ORDER — BACITRACIN ZINC 500 UNIT/GM EX OINT
1.0000 "application " | TOPICAL_OINTMENT | Freq: Two times a day (BID) | CUTANEOUS | Status: DC
  Administered 2016-06-16: 1 via TOPICAL
  Filled 2016-06-16: qty 0.9

## 2016-06-16 NOTE — ED Triage Notes (Signed)
States right thumb laceration from work, [employer], workers comp, bleeding controlled at this time, states tetanus shot in the last year

## 2016-06-16 NOTE — ED Notes (Signed)
Reviewed d/c instructions, follow-up care with pt. Pt verbalized understanding 

## 2016-06-16 NOTE — ED Provider Notes (Signed)
[facility] Emergency Department Provider Note ____________________________________________  Time seen: Approximately 6:30 PM  I have reviewed the triage vital signs and the nursing notes.   HISTORY  Chief Complaint Extremity Laceration    HPI Cathy Bradley is a 45 y.o. female who presents to the emergency department for evaluation of right thumb pain. While at work, she was slicing onions and her finger was crushed in the mechanism of the dicer. She subsequently has a very small laceration to the lateral edge of the thumb, which has stopped bleeding.  Past Medical History:  Diagnosis Date  . AMA (advanced maternal age) multigravida 35+ 2016  . Depression   . Gestational diabetes   . Obesity affecting pregnancy   . Vaginal Pap smear, abnormal     Patient Active Problem List   Diagnosis Date Noted  . Labor and delivery, indication for care 07/30/2015  . Indication for care in labor and delivery, antepartum 04/14/2015  . Advanced maternal age in multigravida 03/27/2015    Past Surgical History:  Procedure Laterality Date  . NO PAST SURGERIES    . TUBAL LIGATION Bilateral 08/01/2015   Procedure: POST PARTUM TUBAL LIGATION;  Surgeon: Suzy Bouchardhomas J Schermerhorn, MD;  Location: [facility] ORS;  Service: Gynecology;  Laterality: Bilateral;    Prior to Admission medications   Medication Sig Start Date End Date Taking? Authorizing Provider  ibuprofen (ADVIL,MOTRIN) 600 MG tablet Take 1 tablet (600 mg total) by mouth every 6 (six) hours. 08/02/15   Ihor Austinhomas J Schermerhorn, MD  oxyCODONE-acetaminophen (PERCOCET/ROXICET) 5-325 MG tablet Take 1-2 tablets by mouth every 4 (four) hours as needed for severe pain. 08/02/15   Suzy Bouchardhomas J Schermerhorn, MD  Prenatal Vit-Fe Fumarate-FA (PRENATAL MULTIVITAMIN) TABS tablet Take 1 tablet by mouth daily at 12 noon.    Historical Provider, MD    Allergies Patient has no known allergies.  History reviewed. No pertinent family  history.  Social History Social History  Substance Use Topics  . Smoking status: Never Smoker  . Smokeless tobacco: Never Used  . Alcohol use No    Review of Systems Constitutional: No recent illness. Cardiovascular: Denies chest pain or palpitations. Respiratory: Denies shortness of breath. Musculoskeletal: Pain in Right thumb Skin: Negative for rash, wound, lesion. Neurological: Negative for focal weakness or numbness.  ____________________________________________   PHYSICAL EXAM:  VITAL SIGNS: ED Triage Vitals  Enc Vitals Group     BP 06/16/16 1810 117/69     Pulse Rate 06/16/16 1810 74     Resp 06/16/16 1810 18     Temp 06/16/16 1810 97.7 F (36.5 C)     Temp Source 06/16/16 1810 Oral     SpO2 06/16/16 1810 100 %     Weight 06/16/16 1811 165 lb (74.8 kg)     Height 06/16/16 1811 5' (1.524 m)     Head Circumference --      Peak Flow --      Pain Score 06/16/16 1811 8     Pain Loc --      Pain Edu? --      Excl. in GC? --     Constitutional: Alert and oriented. Well appearing and in no acute distress. Eyes: Conjunctivae are normal. EOMI. Head: Atraumatic. Neck: No stridor.  Respiratory: Normal respiratory effort.   Musculoskeletal: Limited range of motion of the right thumb secondary to pain. Neurologic:  Normal speech and language. No gross focal neurologic deficits are appreciated. Speech is normal. No gait instability. Skin:  Skin  is warm, dry and intact. 3 mm superficial laceration noted to the lateral nail fold on the right thumb Psychiatric: Mood and affect are normal. Speech and behavior are normal.  ____________________________________________   LABS (all labs ordered are listed, but only abnormal results are displayed)  Labs Reviewed - No data to display ____________________________________________  RADIOLOGY  Right thumb is negative for acute bony abnormality per  radiology. ____________________________________________   PROCEDURES  Procedure(s) performed: Aluminum foam splint applied to the right thumb by RN after superficial laceration was cleaned and bacitracin dressing applied.   ____________________________________________   INITIAL IMPRESSION / ASSESSMENT AND PLAN / ED COURSE  Patient was advised to follow-up with orthopedics for symptoms that are not improving over the week. She is breast feeding, therefore she will be advised to take Tylenol as directed on the packaging for pain. She was advised to keep the wound clean and dry. She was advised to return to the ER for symptoms that change or worsen if unable to schedule an appointment.  Clinical Course     Pertinent labs & imaging results that were available during my care of the patient were reviewed by me and considered in my medical decision making (see chart for details). ____________________________________________   FINAL CLINICAL IMPRESSION(S) / ED DIAGNOSES  Final diagnoses:  Laceration of right thumb without foreign body without damage to nail, initial encounter  Crush injury to thumb, right, initial encounter       Chinita PesterCari B Safi Culotta, FNP 06/16/16 2024    Jennye MoccasinBrian S Quigley, MD 07/02/16 1153

## 2019-09-22 ENCOUNTER — Ambulatory Visit: Payer: Self-pay | Attending: Internal Medicine

## 2019-09-22 DIAGNOSIS — Z23 Encounter for immunization: Secondary | ICD-10-CM

## 2019-09-22 NOTE — Progress Notes (Signed)
   Covid-19 Vaccination Clinic  Name:  Cathy Bradley    MRN: [id number] DOB: 1971-02-12  09/22/2019  [name] was observed post Covid-19 immunization for 15 minutes without incident. She was provided with Vaccine Information Sheet and instruction to access the V-Safe system.   [name] was instructed to call 911 with any severe reactions post vaccine: Marland Kitchen Difficulty breathing  . Swelling of face and throat  . A fast heartbeat  . A bad rash all over body  . Dizziness and weakness   Immunizations Administered    Name Date Dose VIS Date Route   Pfizer COVID-19 Vaccine 09/22/2019 12:33 PM 0.3 mL 06/15/2019 Intramuscular   Manufacturer: [employer], [employer]   Lot: [id number]   NDC: [address]-7

## 2019-10-13 ENCOUNTER — Ambulatory Visit: Payer: Self-pay | Attending: Internal Medicine

## 2019-10-13 DIAGNOSIS — Z23 Encounter for immunization: Secondary | ICD-10-CM

## 2019-10-13 NOTE — Progress Notes (Signed)
   Covid-19 Vaccination Clinic  Name:  Cathy Bradley    MRN: [id number] DOB: 1971/04/05  10/13/2019  [name] was observed post Covid-19 immunization for 15 minutes without incident. She was provided with Vaccine Information Sheet and instruction to access the V-Safe system.   [name] was instructed to call 911 with any severe reactions post vaccine: Marland Kitchen Difficulty breathing  . Swelling of face and throat  . A fast heartbeat  . A bad rash all over body  . Dizziness and weakness   Immunizations Administered    Name Date Dose VIS Date Route   Pfizer COVID-19 Vaccine 10/13/2019 12:57 PM 0.3 mL 06/15/2019 Intramuscular   Manufacturer: [employer], [employer]   Lot: [id number]   NDC: [address]-3

## 2022-11-24 ENCOUNTER — Encounter: Payer: Self-pay | Admitting: Physician Assistant

## 2022-11-25 ENCOUNTER — Other Ambulatory Visit: Payer: Self-pay

## 2022-11-25 DIAGNOSIS — Z1231 Encounter for screening mammogram for malignant neoplasm of breast: Secondary | ICD-10-CM

## 2023-02-07 ENCOUNTER — Encounter: Payer: Self-pay | Admitting: Family Medicine

## 2023-02-07 ENCOUNTER — Ambulatory Visit: Payer: Self-pay | Attending: Hematology and Oncology | Admitting: Hematology and Oncology

## 2023-02-07 ENCOUNTER — Ambulatory Visit
Admission: RE | Admit: 2023-02-07 | Discharge: 2023-02-07 | Disposition: A | Discharge status: Home / Self Care | Payer: Self-pay | Source: Ambulatory Visit | Attending: Obstetrics and Gynecology | Admitting: Obstetrics and Gynecology

## 2023-02-07 VITALS — BP 126/82 | Wt 177.0 lb

## 2023-02-07 DIAGNOSIS — Z1231 Encounter for screening mammogram for malignant neoplasm of breast: Secondary | ICD-10-CM | POA: Insufficient documentation

## 2023-02-07 NOTE — Patient Instructions (Signed)
Taught Cathy Bradley about self breast awareness and gave educational materials to take home. Patient did not need a Pap smear today due to last Pap smear was in 11/22/2022 per patient. Let her know [facility] will cover Pap smears every 5 years unless has a history of abnormal Pap smears. Referred patient to the [facility] for screening mammogram. Appointment scheduled for 02/07/2023. Patient aware of appointment and will be there. Let patient know will follow up with her within the next couple weeks with results. Hardie Lora Calderon verbalized understanding.  Pascal Lux, NP 2:04 PM

## 2023-02-07 NOTE — Progress Notes (Signed)
[name] is Bradley 52 y.o. female who presents to [facility] clinic today with no complaints.    Pap Smear: Pap not smear completed today. Last Pap smear was 11/22/2022 at [facility] clinic and was normal. Per patient has no history of an abnormal Pap smear. Last Pap smear result is available in Epic.   Physical exam: Breasts Breasts symmetrical. No skin abnormalities bilateral breasts. No nipple retraction bilateral breasts. No nipple discharge bilateral breasts. No lymphadenopathy. No lumps palpated bilateral breasts.       Pelvic/Bimanual Pap is not indicated today    Smoking History: Patient has never smoked and was not referred to quit line.    Patient Navigation: Patient education provided. Access to services provided for patient through B[facility]CP program. Cathy Bradley interpreter provided. No transportation provided   Colorectal Cancer Screening: Per patient has never had colonoscopy completed No complaints today. FIT test negative from 11/22/22   Breast and Cervical Cancer Risk Assessment: Patient does not have family history of breast cancer, known genetic mutations, or radiation treatment to the chest before age 51. Patient does not have history of cervical dysplasia, immunocompromised, or DES exposure in-utero.  Risk Scores as of Encounter on 02/07/2023     Cathy Bradley           5-year 0.86%   Lifetime 8.05%   This patient is [place] but has no documented birth country, so the [place] model used data from [place] patients to calculate their risk score. Document [place] birth country in the Demographics activity for Bradley more accurate score.         Last calculated by Cathy Bradley, CMA on 02/07/2023 at  1:52 PM        Bradley: B[facility]CP exam without pap smear No complaints with benign exam.   P: Referred patient to the [facility] for Bradley screening mammogram. Appointment scheduled 02/07/23.  Cathy Basset A, NP 02/07/2023 2:01 PM

## 2023-05-03 ENCOUNTER — Encounter: Payer: Self-pay | Admitting: Family Medicine
